# Patient Record
Sex: Male | Born: 1940 | Race: White | Hispanic: No | Marital: Married | Smoking: Current every day smoker
Health system: Southern US, Community
[De-identification: ages and names within clinical notes are randomized; demographics above are authoritative.]

## PROBLEM LIST (undated history)

## (undated) DIAGNOSIS — E785 Hyperlipidemia, unspecified: Secondary | ICD-10-CM

## (undated) DIAGNOSIS — I2510 Atherosclerotic heart disease of native coronary artery without angina pectoris: Secondary | ICD-10-CM

## (undated) DIAGNOSIS — J449 Chronic obstructive pulmonary disease, unspecified: Secondary | ICD-10-CM

## (undated) DIAGNOSIS — I739 Peripheral vascular disease, unspecified: Secondary | ICD-10-CM

## (undated) HISTORY — PX: OTHER SURGICAL HISTORY: SHX169

## (undated) SURGERY — INSERTION, CARDIAC PACEMAKER
Anesthesia: Moderate Sedation | Laterality: Left

---

## 2001-03-20 ENCOUNTER — Encounter: Payer: Self-pay | Admitting: Neurosurgery

## 2001-03-20 ENCOUNTER — Ambulatory Visit (HOSPITAL_COMMUNITY): Admission: RE | Admit: 2001-03-20 | Discharge: 2001-03-20 | Payer: Self-pay | Admitting: Neurosurgery

## 2009-05-09 ENCOUNTER — Ambulatory Visit: Payer: Self-pay | Admitting: Ophthalmology

## 2009-06-13 ENCOUNTER — Ambulatory Visit: Payer: Self-pay | Admitting: Ophthalmology

## 2009-07-25 ENCOUNTER — Ambulatory Visit: Payer: Self-pay | Admitting: Ophthalmology

## 2011-11-20 ENCOUNTER — Ambulatory Visit: Payer: Self-pay | Admitting: Vascular Surgery

## 2011-11-20 LAB — BASIC METABOLIC PANEL
Anion Gap: 9 (ref 7–16)
BUN: 15 mg/dL (ref 7–18)
Calcium, Total: 8.9 mg/dL (ref 8.5–10.1)
Chloride: 109 mmol/L — ABNORMAL HIGH (ref 98–107)
Co2: 23 mmol/L (ref 21–32)
Creatinine: 0.86 mg/dL (ref 0.60–1.30)
EGFR (African American): >60
EGFR (Non-African Amer.): >60
Glucose: 92 mg/dL (ref 65–99)
Osmolality: 282 (ref 275–301)
Potassium: 4.1 mmol/L (ref 3.5–5.1)
Sodium: 141 mmol/L (ref 136–145)

## 2012-02-03 ENCOUNTER — Ambulatory Visit: Payer: Self-pay | Admitting: Vascular Surgery

## 2012-02-03 LAB — CREATININE, SERUM
Creatinine: 0.89 mg/dL (ref 0.60–1.30)
EGFR (African American): >60
EGFR (Non-African Amer.): >60

## 2012-02-03 LAB — BUN: BUN: 17 mg/dL (ref 7–18)

## 2013-03-25 ENCOUNTER — Ambulatory Visit: Payer: Self-pay | Admitting: Internal Medicine

## 2013-04-08 ENCOUNTER — Ambulatory Visit: Payer: Self-pay | Admitting: Internal Medicine

## 2014-05-10 NOTE — Op Note (Signed)
PATIENT NAME:  Timothy Mcmahon, Timothy Mcmahon MR#:  [id number] DATE OF BIRTH:  05/19/1940  DATE OF PROCEDURE:  11/20/2011  PREOPERATIVE DIAGNOSES:  1. Peripheral artery disease with claudication of bilateral lower extremities.  2. Tobacco dependence.  3. Chronic obstructive pulmonary disease.  POSTOPERATIVE DIAGNOSES:  1. Peripheral artery disease with claudication of bilateral lower extremities.  2. Tobacco dependence.  3. Chronic obstructive pulmonary disease.  PROCEDURES PERFORMED: 1. Catheter placement into left popliteal artery from right femoral approach.  2. Aortogram and selective left lower extremity angiogram.  3. Percutaneous transluminal angioplasty of left SFA with 6 mm diameter angioplasty balloon.  4. Self-expanding stent placement to left SFA for greater than 30% residual stenosis and dissection after angioplasty in the left superficial femoral artery.   SURGEON: Algernon Huxley, M.D.   ANESTHESIA: Local with moderate conscious sedation.   ESTIMATED BLOOD LOSS: 25 mL.   INDICATION FOR PROCEDURE: This is a 74 year old white male with bilateral lower extremity claudication, left worse than right. This is severe and lifestyle limiting. He desires intervention. Risks and benefits were discussed and informed consent was obtained.   DESCRIPTION OF PROCEDURE: The patient was brought to the vascular interventional radiology suite, groins were shaved and prepped and a sterile surgical field was created. The right femoral head was localized with fluoroscopy and the right femoral artery was accessed without difficulty with a Seldinger needle. A J-wire and 5 French sheath were placed. Pigtail catheter was placed in the aorta at the L1 level and AP aortogram was performed. This showed no flow-limiting stenosis in the aortoiliac segments with two renal arteries on the left and one on the right which were patent. I then hooked the aortic bifurcation and advanced to the left femoral head and selective left  lower extremity angiogram was then performed. This demonstrated an occlusion of the SFA of approximately 8 to 10 cm beyond its origin. This reconstituted in the distal superficial femoral artery and he had two-vessel runoff distally. The patient was systemically heparinized. A 6 French Ansel sheath was placed over a Terumo Advantage wire. The Terumo Advantage wire and Kumpe catheter were used to cross the occlusion without difficulty and intraluminal flow was confirmed in the popliteal artery. I then removed the diagnostic catheter after replacing the wire and performed percutaneous transluminal angioplasty of this area with a 6 mm diameter angioplasty balloon. Following angioplasty there was a long spiral dissection with areas of high-grade stenosis, residual, both to the proximal and distal endpoint, and a 7 mm diameter x 27 cm in length was used to encompass this residual lesion and this was deployed and ironed out with a 6 mm balloon with excellent angiographic completion result. The sheath was then pulled back to the ipsilateral external iliac artery and oblique arteriogram was performed and StarClose closure device was deployed in the usual fashion with excellent hemostatic result. The patient tolerated the procedure well and was taken to the recovery room in stable condition.  ____________________________ Algernon Huxley, MD jsd:slb D: 11/20/2011 14:39:44 ET T: 11/20/2011 15:12:26 ET JOB#: [id number]  cc: Algernon Huxley, MD, <Dictator> Algernon Huxley MD ELECTRONICALLY SIGNED 11/25/2011 13:13

## 2014-05-13 NOTE — Op Note (Signed)
PATIENT NAME:  Timothy Mcmahon, Timothy Mcmahon MR#:  [id number] DATE OF BIRTH:  11-12-1940  DATE OF PROCEDURE:  02/03/2012  PREOPERATIVE DIAGNOSES:  1.  Peripheral arterial disease with short distant claudication, left lower extremity, recurrent.  2.  Tobacco dependence.  3.  Chronic obstructive pulmonary disease.   POSTOPERATIVE DIAGNOSES:  1.  Peripheral arterial disease with short distant claudication, left lower extremity, recurrent.  2.  Tobacco dependence.  3.  Chronic obstructive pulmonary disease.   PROCEDURES:  1.  Catheter placement into left popliteal artery from right femoral approach.  2.  Aortogram, selective left lower extremity angiogram.  3.  Percutaneous transluminal angioplasty of long superficial femoral artery occlusion was 6 mm diameter angioplasty balloon.  4.  Viabahn stent placement x3 to encompass the residual stenosis after angioplasty of the left superficial femoral artery.  5.  StarClose closure right femoral artery.   SURGEON: Algernon Huxley, M.D.   ANESTHESIA: Local with moderate conscious sedation.   ESTIMATED BLOOD LOSS: Approximately 25 mL.   fluoroscopy time: Approximately 14 minutes.   INDICATION FOR PROCEDURE: This is a 74 year old individual who has recurrent short distance claudication, left lower extremity. He is status post percutaneous intervention last year.  He is brought back for a repeat evaluation and possible intervention given the drop in his ABI from 1.2 to 0.5 and his short distance symptoms. Risks and benefits were discussed. Informed consent was obtained.   DESCRIPTION OF PROCEDURE: The patient was brought to the vascular interventional radiology suite. Groins were shaved and prepped and a sterile surgical field was created. The right femoral head was localized with fluoroscopy and the right femoral head was accessed without difficulty with a Seldinger needle. J-wire was placed. A 5 French sheath was then placed over the wire.  A pigtail catheter was  placed in the aorta at the L1 level.  Arteriogram was  performed. This showed two left renal arteries, one right renal artery, good renal perfusion. The aorta and iliac segments appeared patent, although there was calcific disease present. I then hooked the area of bifurcation and advanced to left femoral head and selective left lower extremity angiogram was then performed. This showed occlusion of the left superficial femoral artery about a centimeter or two beyond its origin.  The previously placed stent was occluded and had multiple areas of stent fracture within that stent. His above-knee popliteal artery then reconstituted and he had two-vessel runoff distally. The patient was systemically heparinized.  A 6 and then later a 7 [place] Ansel sheath was placed over a Terumo Advantage wire. I was able to cross the occluded stents without significant difficulty with the Terumo Advantage wire but could not get a Kumpe catheter to pass through the occluded struts.  A CXI catheter was able to traverse through it and we confirmed intraluminal flow in the popliteal artery. I then replaced the wire and ballooned this area with a 4 mm diameter angioplasty balloon and eventually the entire previously occluded stent, the SFA above and just below the stent with a 6 mm diameter angioplasty balloon.  There was still some significant residual disease; and with the stent fractures, I elected to line this with Viabahn stents.  I exchanged for an 0.018 wire but  still had to upsize to a 7 [place] sheath as we did not have enough 6 mm diameter Viabahn on the shelf, and we needed to place a 7 mm diameter Viabahn stent proximally. A 6 mm stent was placed in the mid section  to the most distal fracture within the stent.  Could not be crossed with a 6 mm Viabahn even after predilatation with a 7 mm balloon, and so I exchanged for a 5 mm Viabahn, which did cross the lesion. This was taken distally down to Hunter's canal and proximally  within the old stent. There was a 6 mm stent placed in the mid section and then a 7 mm stent proximally. These were all ironed out with a 6 mm high-pressure balloon, and completion angiogram now showed these areas to be widely patent with good preserve two-vessel runoff distally. At this point, I elected to terminate the procedure. The sheath was pulled back to the ipsilateral external artery and oblique arteriogram was performed. StarClose closure device was deployed in the usual fashion with excellent hemostatic result. The patient tolerated the procedure well and was taken to the recovery room in stable condition.     ____________________________ Algernon Huxley, MD jsd:th D: 02/03/2012 15:18:34 ET T: 02/03/2012 22:21:58 ET JOB#: [id number]  cc: Algernon Huxley, MD, <Dictator> DR. Alean Rinne Algernon Huxley MD ELECTRONICALLY SIGNED 02/07/2012 10:30

## 2015-01-22 HISTORY — PX: CARDIAC CATHETERIZATION: SHX172

## 2018-04-13 ENCOUNTER — Emergency Department: Payer: Medicare Other

## 2018-04-13 ENCOUNTER — Other Ambulatory Visit: Payer: Self-pay

## 2018-04-13 ENCOUNTER — Inpatient Hospital Stay
Admission: EM | Admit: 2018-04-13 | Discharge: 2018-04-16 | DRG: 308 | Disposition: A | Discharge status: Home / Self Care | Payer: Medicare Other | Attending: Internal Medicine | Admitting: Internal Medicine

## 2018-04-13 ENCOUNTER — Encounter: Payer: Self-pay | Admitting: Specialist

## 2018-04-13 DIAGNOSIS — J440 Chronic obstructive pulmonary disease with acute lower respiratory infection: Secondary | ICD-10-CM | POA: Diagnosis present

## 2018-04-13 DIAGNOSIS — K219 Gastro-esophageal reflux disease without esophagitis: Secondary | ICD-10-CM | POA: Diagnosis present

## 2018-04-13 DIAGNOSIS — I440 Atrioventricular block, first degree: Secondary | ICD-10-CM | POA: Diagnosis present

## 2018-04-13 DIAGNOSIS — Z95818 Presence of other cardiac implants and grafts: Secondary | ICD-10-CM

## 2018-04-13 DIAGNOSIS — J189 Pneumonia, unspecified organism: Secondary | ICD-10-CM | POA: Diagnosis present

## 2018-04-13 DIAGNOSIS — N400 Enlarged prostate without lower urinary tract symptoms: Secondary | ICD-10-CM | POA: Diagnosis present

## 2018-04-13 DIAGNOSIS — Z7902 Long term (current) use of antithrombotics/antiplatelets: Secondary | ICD-10-CM

## 2018-04-13 DIAGNOSIS — E876 Hypokalemia: Secondary | ICD-10-CM | POA: Diagnosis present

## 2018-04-13 DIAGNOSIS — Z886 Allergy status to analgesic agent status: Secondary | ICD-10-CM

## 2018-04-13 DIAGNOSIS — R911 Solitary pulmonary nodule: Secondary | ICD-10-CM | POA: Diagnosis present

## 2018-04-13 DIAGNOSIS — Z23 Encounter for immunization: Secondary | ICD-10-CM

## 2018-04-13 DIAGNOSIS — Z95820 Peripheral vascular angioplasty status with implants and grafts: Secondary | ICD-10-CM

## 2018-04-13 DIAGNOSIS — R55 Syncope and collapse: Secondary | ICD-10-CM | POA: Diagnosis present

## 2018-04-13 DIAGNOSIS — I10 Essential (primary) hypertension: Secondary | ICD-10-CM | POA: Diagnosis present

## 2018-04-13 DIAGNOSIS — D649 Anemia, unspecified: Secondary | ICD-10-CM | POA: Diagnosis present

## 2018-04-13 DIAGNOSIS — E785 Hyperlipidemia, unspecified: Secondary | ICD-10-CM | POA: Diagnosis present

## 2018-04-13 DIAGNOSIS — S51012A Laceration without foreign body of left elbow, initial encounter: Secondary | ICD-10-CM | POA: Diagnosis present

## 2018-04-13 DIAGNOSIS — I495 Sick sinus syndrome: Principal | ICD-10-CM | POA: Diagnosis present

## 2018-04-13 DIAGNOSIS — T82856A Stenosis of peripheral vascular stent, initial encounter: Secondary | ICD-10-CM | POA: Diagnosis present

## 2018-04-13 DIAGNOSIS — Z20828 Contact with and (suspected) exposure to other viral communicable diseases: Secondary | ICD-10-CM | POA: Diagnosis present

## 2018-04-13 DIAGNOSIS — I739 Peripheral vascular disease, unspecified: Secondary | ICD-10-CM | POA: Diagnosis present

## 2018-04-13 DIAGNOSIS — W1839XA Other fall on same level, initial encounter: Secondary | ICD-10-CM | POA: Diagnosis present

## 2018-04-13 DIAGNOSIS — I951 Orthostatic hypotension: Secondary | ICD-10-CM | POA: Diagnosis present

## 2018-04-13 DIAGNOSIS — Y939 Activity, unspecified: Secondary | ICD-10-CM

## 2018-04-13 DIAGNOSIS — Z8249 Family history of ischemic heart disease and other diseases of the circulatory system: Secondary | ICD-10-CM

## 2018-04-13 DIAGNOSIS — R42 Dizziness and giddiness: Secondary | ICD-10-CM

## 2018-04-13 DIAGNOSIS — Y929 Unspecified place or not applicable: Secondary | ICD-10-CM

## 2018-04-13 DIAGNOSIS — F17210 Nicotine dependence, cigarettes, uncomplicated: Secondary | ICD-10-CM | POA: Diagnosis present

## 2018-04-13 DIAGNOSIS — Z955 Presence of coronary angioplasty implant and graft: Secondary | ICD-10-CM

## 2018-04-13 DIAGNOSIS — N179 Acute kidney failure, unspecified: Secondary | ICD-10-CM | POA: Diagnosis present

## 2018-04-13 DIAGNOSIS — Y990 Civilian activity done for income or pay: Secondary | ICD-10-CM

## 2018-04-13 DIAGNOSIS — I2510 Atherosclerotic heart disease of native coronary artery without angina pectoris: Secondary | ICD-10-CM | POA: Diagnosis present

## 2018-04-13 HISTORY — DX: Chronic obstructive pulmonary disease, unspecified: J44.9

## 2018-04-13 HISTORY — DX: Peripheral vascular disease, unspecified: I73.9

## 2018-04-13 HISTORY — DX: Atherosclerotic heart disease of native coronary artery without angina pectoris: I25.10

## 2018-04-13 HISTORY — DX: Hyperlipidemia, unspecified: E78.5

## 2018-04-13 LAB — BASIC METABOLIC PANEL
ANION GAP: 10 (ref 5–15)
BUN: 23 mg/dL (ref 8–23)
CO2: 21 mmol/L — ABNORMAL LOW (ref 22–32)
Calcium: 8.5 mg/dL — ABNORMAL LOW (ref 8.9–10.3)
Chloride: 107 mmol/L (ref 98–111)
Creatinine, Ser: 1.4 mg/dL — ABNORMAL HIGH (ref 0.61–1.24)
GFR calc Af Amer: 56 mL/min — ABNORMAL LOW (ref >60)
GFR calc non Af Amer: 48 mL/min — ABNORMAL LOW (ref >60)
Glucose, Bld: 105 mg/dL — ABNORMAL HIGH (ref 70–99)
Potassium: 3 mmol/L — ABNORMAL LOW (ref 3.5–5.1)
Sodium: 138 mmol/L (ref 135–145)

## 2018-04-13 LAB — CBC
HEMATOCRIT: 38.6 % — AB (ref 39.0–52.0)
Hemoglobin: 12.7 g/dL — ABNORMAL LOW (ref 13.0–17.0)
MCH: 31.8 pg (ref 26.0–34.0)
MCHC: 32.9 g/dL (ref 30.0–36.0)
MCV: 96.5 fL (ref 80.0–100.0)
Platelets: 142 10*3/uL — ABNORMAL LOW (ref 150–400)
RBC: 4 MIL/uL — ABNORMAL LOW (ref 4.22–5.81)
RDW: 13.8 % (ref 11.5–15.5)
WBC: 9.4 10*3/uL (ref 4.0–10.5)
nRBC: 0 % (ref 0.0–0.2)

## 2018-04-13 LAB — BRAIN NATRIURETIC PEPTIDE: B Natriuretic Peptide: 159 pg/mL — ABNORMAL HIGH (ref 0.0–100.0)

## 2018-04-13 LAB — HEPATIC FUNCTION PANEL
ALT: 16 U/L (ref 0–44)
AST: 18 U/L (ref 15–41)
Albumin: 3.3 g/dL — ABNORMAL LOW (ref 3.5–5.0)
Alkaline Phosphatase: 92 U/L (ref 38–126)
Bilirubin, Direct: 0.1 mg/dL (ref 0.0–0.2)
Indirect Bilirubin: 0.5 mg/dL (ref 0.3–0.9)
Total Bilirubin: 0.6 mg/dL (ref 0.3–1.2)
Total Protein: 6.6 g/dL (ref 6.5–8.1)

## 2018-04-13 LAB — TROPONIN I
Troponin I: <0.03 ng/mL (ref <0.03)
Troponin I: <0.03 ng/mL (ref <0.03)

## 2018-04-13 MED ORDER — ALBUTEROL SULFATE (2.5 MG/3ML) 0.083% IN NEBU: 2.5000 mg | INHALATION_SOLUTION | Freq: Four times a day (QID) | RESPIRATORY_TRACT | Status: DC | PRN

## 2018-04-13 MED ORDER — PANTOPRAZOLE SODIUM 40 MG PO TBEC
40.0000 mg | DELAYED_RELEASE_TABLET | Freq: Every day | ORAL | Status: DC
  Administered 2018-04-14 – 2018-04-16 (×3): 40 mg via ORAL
  Filled 2018-04-13 (×3): qty 1

## 2018-04-13 MED ORDER — HEPARIN SODIUM (PORCINE) 5000 UNIT/ML IJ SOLN
5000.0000 [IU] | Freq: Three times a day (TID) | INTRAMUSCULAR | Status: DC
  Administered 2018-04-13 – 2018-04-15 (×5): 5000 [IU] via SUBCUTANEOUS
  Filled 2018-04-13 (×5): qty 1

## 2018-04-13 MED ORDER — ONDANSETRON HCL 4 MG PO TABS: 4.0000 mg | ORAL_TABLET | Freq: Four times a day (QID) | ORAL | Status: DC | PRN

## 2018-04-13 MED ORDER — TRAZODONE HCL 100 MG PO TABS
100.0000 mg | ORAL_TABLET | Freq: Every day | ORAL | Status: DC
  Administered 2018-04-13 – 2018-04-15 (×3): 100 mg via ORAL
  Filled 2018-04-13 (×3): qty 1

## 2018-04-13 MED ORDER — MOMETASONE FURO-FORMOTEROL FUM 200-5 MCG/ACT IN AERO
2.0000 | INHALATION_SPRAY | Freq: Two times a day (BID) | RESPIRATORY_TRACT | Status: DC
  Administered 2018-04-14 – 2018-04-16 (×5): 2 via RESPIRATORY_TRACT
  Filled 2018-04-13: qty 8.8

## 2018-04-13 MED ORDER — SODIUM CHLORIDE 0.9 % IV SOLN
INTRAVENOUS | Status: AC
  Administered 2018-04-13 – 2018-04-14 (×2): via INTRAVENOUS

## 2018-04-13 MED ORDER — METOPROLOL TARTRATE 25 MG PO TABS
25.0000 mg | ORAL_TABLET | Freq: Two times a day (BID) | ORAL | Status: DC
  Administered 2018-04-13 – 2018-04-16 (×6): 25 mg via ORAL
  Filled 2018-04-13 (×6): qty 1

## 2018-04-13 MED ORDER — ACETAMINOPHEN 325 MG PO TABS
650.0000 mg | ORAL_TABLET | Freq: Four times a day (QID) | ORAL | Status: DC | PRN
  Administered 2018-04-14 – 2018-04-15 (×2): 650 mg via ORAL
  Filled 2018-04-13 (×2): qty 2

## 2018-04-13 MED ORDER — ACETAMINOPHEN 650 MG RE SUPP: 650.0000 mg | Freq: Four times a day (QID) | RECTAL | Status: DC | PRN

## 2018-04-13 MED ORDER — TIOTROPIUM BROMIDE MONOHYDRATE 18 MCG IN CAPS
1.0000 | ORAL_CAPSULE | Freq: Every day | RESPIRATORY_TRACT | Status: DC
  Administered 2018-04-14 – 2018-04-16 (×3): 18 ug via RESPIRATORY_TRACT
  Filled 2018-04-13: qty 5

## 2018-04-13 MED ORDER — TAMSULOSIN HCL 0.4 MG PO CAPS
0.4000 mg | ORAL_CAPSULE | Freq: Every day | ORAL | Status: DC
  Administered 2018-04-14 – 2018-04-16 (×3): 0.4 mg via ORAL
  Filled 2018-04-13 (×3): qty 1

## 2018-04-13 MED ORDER — SODIUM CHLORIDE 0.9% FLUSH: 3.0000 mL | Freq: Once | INTRAVENOUS | Status: DC

## 2018-04-13 MED ORDER — CLOPIDOGREL BISULFATE 75 MG PO TABS
75.0000 mg | ORAL_TABLET | Freq: Every day | ORAL | Status: DC
  Administered 2018-04-14: 75 mg via ORAL
  Filled 2018-04-13: qty 1

## 2018-04-13 MED ORDER — ATORVASTATIN CALCIUM 20 MG PO TABS
80.0000 mg | ORAL_TABLET | Freq: Every day | ORAL | Status: DC
  Administered 2018-04-14 – 2018-04-16 (×3): 80 mg via ORAL
  Filled 2018-04-13 (×3): qty 4

## 2018-04-13 MED ORDER — ONDANSETRON HCL 4 MG/2ML IJ SOLN: 4.0000 mg | Freq: Four times a day (QID) | INTRAMUSCULAR | Status: DC | PRN

## 2018-04-13 MED ORDER — POTASSIUM CHLORIDE CRYS ER 20 MEQ PO TBCR
40.0000 meq | EXTENDED_RELEASE_TABLET | Freq: Once | ORAL | Status: AC
  Administered 2018-04-13: 40 meq via ORAL
  Filled 2018-04-13: qty 2

## 2018-04-13 MED ORDER — IOHEXOL 350 MG/ML SOLN
75.0000 mL | Freq: Once | INTRAVENOUS | Status: AC | PRN
  Administered 2018-04-13: 75 mL via INTRAVENOUS

## 2018-04-13 MED ORDER — INFLUENZA VAC SPLIT HIGH-DOSE 0.5 ML IM SUSY
0.5000 mL | PREFILLED_SYRINGE | INTRAMUSCULAR | Status: DC
  Filled 2018-04-13: qty 0.5

## 2018-04-13 MED ORDER — PNEUMOCOCCAL VAC POLYVALENT 25 MCG/0.5ML IJ INJ: 0.5000 mL | INJECTION | INTRAMUSCULAR | Status: DC

## 2018-04-13 MED ORDER — SODIUM CHLORIDE 0.9 % IV BOLUS
1000.0000 mL | Freq: Once | INTRAVENOUS | Status: AC
  Administered 2018-04-13: 1000 mL via INTRAVENOUS

## 2018-04-13 NOTE — ED Notes (Signed)
ED TO INPATIENT HANDOFF REPORT  ED Nurse Name and Phone #: Anderson Malta [phone]  S Name/Age/Gender Timothy Mcmahon 78 y.o. male Room/Bed: ED04A/ED04A  Code Status   Code Status: Not on file  Home/SNF/Other Home Patient oriented to: self, place, time and situation Is this baseline? Yes   Triage Complete: Triage complete  Chief Complaint syncopal ems  Triage Note Pt arrived via [facility] with near sycope from work. Pt got dizzy and fell, skin tear to left elbow. Pt did not eat today, Last supper was 1800 yesterday evening. Pt is orthostatic positive. Systolic 99 to 79. EMS gave fluids, BP resonded but dropped when fluids decreased. cbg-110.     Allergies Allergies  Allergen Reactions  . Aspirin Nausea And Vomiting    With high dose ASA only    Level of Care/Admitting Diagnosis ED Disposition    ED Disposition Condition [facility] Area: [place] [[id number]]  Level of Care: Telemetry [5]  Diagnosis: Pre-syncope [867544]  Admitting Physician: Henreitta Leber [[id number]]  Attending Physician: Henreitta Leber [[id number]]  PT Class (Do Not Modify): Observation [104]  PT Acc Code (Do Not Modify): Observation [10022]       B Medical/Surgery History No past medical history on file.    A IV Location/Drains/Wounds Patient Lines/Drains/Airways Status   Active Line/Drains/Airways    Name:   Placement date:   Placement time:   Site:   Days:   Peripheral IV 04/13/18 Left Forearm   04/13/18    1106    Forearm   less than 1          Intake/Output Last 24 hours No intake or output data in the 24 hours ending 04/13/18 1515  Labs/Imaging Results for orders placed or performed during the hospital encounter of 04/13/18 (from the past 48 hour(s))  Basic metabolic panel     Status: Abnormal   Collection Time: 04/13/18 11:05 AM  Result Value Ref Range   Sodium 138 135 - 145 mmol/L   Potassium 3.0 (L) 3.5 - 5.1 mmol/L   Chloride 107 98 - 111  mmol/L   CO2 21 (L) 22 - 32 mmol/L   Glucose, Bld 105 (H) 70 - 99 mg/dL   BUN 23 8 - 23 mg/dL   Creatinine, Ser 1.40 (H) 0.61 - 1.24 mg/dL   Calcium 8.5 (L) 8.9 - 10.3 mg/dL   GFR calc non Af Amer 48 (L) >60 mL/min   GFR calc Af Amer 56 (L) >60 mL/min   Anion gap 10 5 - 15    Comment: Performed at [facility], [address] [place], [place] [address]  CBC     Status: Abnormal   Collection Time: 04/13/18 11:05 AM  Result Value Ref Range   WBC 9.4 4.0 - 10.5 K/uL   RBC 4.00 (L) 4.22 - 5.81 MIL/uL   Hemoglobin 12.7 (L) 13.0 - 17.0 g/dL   HCT 38.6 (L) 39.0 - 52.0 %   MCV 96.5 80.0 - 100.0 fL   MCH 31.8 26.0 - 34.0 pg   MCHC 32.9 30.0 - 36.0 g/dL   RDW 13.8 11.5 - 15.5 %   Platelets 142 (L) 150 - 400 K/uL   nRBC 0.0 0.0 - 0.2 %    Comment: Performed at [facility], [address], [place], [place] [address]  Hepatic function panel     Status: Abnormal   Collection Time: 04/13/18 11:05 AM  Result Value Ref Range  Total Protein 6.6 6.5 - 8.1 g/dL   Albumin 3.3 (L) 3.5 - 5.0 g/dL   AST 18 15 - 41 U/L   ALT 16 0 - 44 U/L   Alkaline Phosphatase 92 38 - 126 U/L   Total Bilirubin 0.6 0.3 - 1.2 mg/dL   Bilirubin, Direct 0.1 0.0 - 0.2 mg/dL   Indirect Bilirubin 0.5 0.3 - 0.9 mg/dL    Comment: Performed at [facility], [address] [place], [place] [address]  Brain natriuretic peptide     Status: Abnormal   Collection Time: 04/13/18 11:05 AM  Result Value Ref Range   B Natriuretic Peptide 159.0 (H) 0.0 - 100.0 pg/mL    Comment: Performed at [facility], [address] [place], [place] [address]  Troponin I - Add-On to previous collection     Status: None   Collection Time: 04/13/18 11:05 AM  Result Value Ref Range   Troponin I <0.03 <0.03 ng/mL    Comment: Performed at [facility], [address], [place], [place] [address]   Ct Angio Head W Or Wo Contrast  Result Date: 04/13/2018 CLINICAL DATA:  Persistent central  vertigo. EXAM: CT ANGIOGRAPHY HEAD AND NECK TECHNIQUE: Multidetector CT imaging of the head and neck was performed using the standard protocol during bolus administration of intravenous contrast. Multiplanar CT image reconstructions and MIPs were obtained to evaluate the vascular anatomy. Carotid stenosis measurements (when applicable) are obtained utilizing NASCET criteria, using the distal internal carotid diameter as the denominator. CONTRAST:  28mL OMNIPAQUE IOHEXOL 350 MG/ML SOLN COMPARISON:  CTA head 05/01/2016. FINDINGS: CT HEAD FINDINGS Brain: No evidence of acute infarction, hemorrhage, hydrocephalus, extra-axial collection or mass lesion/mass effect. Vascular: Reported separately. Skull: Normal. Negative for fracture or focal lesion. Sinuses: Imaged portions are clear. Orbits: No acute finding. Review of the MIP images confirms the above findings CTA NECK FINDINGS Aortic arch: Variant branching, LEFT vertebral arises directly from arch imaged portion shows no evidence of aneurysm or dissection. No significant stenosis of the innominate, common carotid, or RIGHT subclavian origins. 75-90% stenosis proximal LEFT subclavian. Aortic atherosclerosis. Right carotid system: Minor atheromatous change RIGHT common carotid origin. RIGHT carotid stent extends from the common carotid artery across the bifurcation into the proximal ICA. IntraStent stenosis in the proximal RIGHT ICA, of 50% based on luminal diameter of 2.0/4.0 proximal/distal. See series 8, image 102. Left carotid system: No evidence of dissection, stenosis (50% or greater) or occlusion, status post LEFT carotid endarterectomy. Vertebral arteries: 50% ostial stenosis on the RIGHT. No ostial stenosis on the LEFT. Vertebrals are patent throughout the neck, with a RIGHT mildly dominant. Skeleton: No worrisome osseous lesion.  C6-C7 fusion. Other neck: No masses. Upper chest: Azygos lobe. Scarring and emphysema. Fluid and calcification in the azygos  fissure. Review of the MIP images confirms the above findings CTA HEAD FINDINGS Anterior circulation: Calcification of the cavernous internal carotid arteries consistent with cerebrovascular atherosclerotic disease. Estimated BILATERAL calcific siphon stenoses are 50-75%, stable from priors. No signs of intracranial large vessel occlusion. Posterior circulation: RIGHT vertebral dominant. LEFT vertebral supplies PICA, with rudimentary basilar connection. There is at estimated 50% stenosis of the proximal basilar. Distal basilar appears normal caliber. No cerebellar branch occlusion. Venous sinuses: As permitted by contrast timing, patent. Anatomic variants: None of significance. Delayed phase: No abnormal postcontrast enhancement. Review of the MIP images confirms the above findings IMPRESSION: Status post LEFT carotid endarterectomy, widely patent. 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 Non dominant LEFT vertebral, arising from the arch, widely patent. 50% stenosis  dominant RIGHT vertebral, stable from priors. 50% stenosis proximal basilar, stable from priors. 50-75% calcific stenosis both carotid siphons, stable from priors. Electronically Signed   By: Staci Righter M.D.   On: 04/13/2018 13:20   Dg Chest 2 View  Result Date: 04/13/2018 CLINICAL DATA:  Orthostatic hypotension. EXAM: CHEST - 2 VIEW COMPARISON:  CT chest dated July 04, 2017. Chest x-ray dated May 01, 2016. FINDINGS: New loop recorder. The heart size and mediastinal contours are within normal limits. Normal pulmonary vascularity. 8 mm nodular density in the left upper lobe appears slightly increased when compared to prior chest CT. Minimal left basilar atelectasis. No focal consolidation, pleural effusion, or pneumothorax. No acute osseous abnormality. IMPRESSION: 1. 8 mm nodular density in the left upper lobe appears slightly increased when compared  to prior chest CT. Further evaluation with non-emergent chest CT is recommended. 2.  No active cardiopulmonary disease. Electronically Signed   By: Titus Dubin M.D.   On: 04/13/2018 12:35   Ct Angio Neck W And/or Wo Contrast  Result Date: 04/13/2018 CLINICAL DATA:  Persistent central vertigo. EXAM: CT ANGIOGRAPHY HEAD AND NECK TECHNIQUE: Multidetector CT imaging of the head and neck was performed using the standard protocol during bolus administration of intravenous contrast. Multiplanar CT image reconstructions and MIPs were obtained to evaluate the vascular anatomy. Carotid stenosis measurements (when applicable) are obtained utilizing NASCET criteria, using the distal internal carotid diameter as the denominator. CONTRAST:  54mL OMNIPAQUE IOHEXOL 350 MG/ML SOLN COMPARISON:  CTA head 05/01/2016. FINDINGS: CT HEAD FINDINGS Brain: No evidence of acute infarction, hemorrhage, hydrocephalus, extra-axial collection or mass lesion/mass effect. Vascular: Reported separately. Skull: Normal. Negative for fracture or focal lesion. Sinuses: Imaged portions are clear. Orbits: No acute finding. Review of the MIP images confirms the above findings CTA NECK FINDINGS Aortic arch: Variant branching, LEFT vertebral arises directly from arch imaged portion shows no evidence of aneurysm or dissection. No significant stenosis of the innominate, common carotid, or RIGHT subclavian origins. 75-90% stenosis proximal LEFT subclavian. Aortic atherosclerosis. Right carotid system: Minor atheromatous change RIGHT common carotid origin. RIGHT carotid stent extends from the common carotid artery across the bifurcation into the proximal ICA. IntraStent stenosis in the proximal RIGHT ICA, of 50% based on luminal diameter of 2.0/4.0 proximal/distal. See series 8, image 102. Left carotid system: No evidence of dissection, stenosis (50% or greater) or occlusion, status post LEFT carotid endarterectomy. Vertebral arteries: 50% ostial  stenosis on the RIGHT. No ostial stenosis on the LEFT. Vertebrals are patent throughout the neck, with a RIGHT mildly dominant. Skeleton: No worrisome osseous lesion.  C6-C7 fusion. Other neck: No masses. Upper chest: Azygos lobe. Scarring and emphysema. Fluid and calcification in the azygos fissure. Review of the MIP images confirms the above findings CTA HEAD FINDINGS Anterior circulation: Calcification of the cavernous internal carotid arteries consistent with cerebrovascular atherosclerotic disease. Estimated BILATERAL calcific siphon stenoses are 50-75%, stable from priors. No signs of intracranial large vessel occlusion. Posterior circulation: RIGHT vertebral dominant. LEFT vertebral supplies PICA, with rudimentary basilar connection. There is at estimated 50% stenosis of the proximal basilar. Distal basilar appears normal caliber. No cerebellar branch occlusion. Venous sinuses: As permitted by contrast timing, patent. Anatomic variants: None of significance. Delayed phase: No abnormal postcontrast enhancement. Review of the MIP images confirms the above findings IMPRESSION: Status post LEFT carotid endarterectomy, widely patent. IntraStent stenosis, proximal  RIGHT ICA, 50%. 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 Non dominant LEFT vertebral, arising from the arch, widely patent. 50% stenosis  dominant RIGHT vertebral, stable from priors. 50% stenosis proximal basilar, stable from priors. 50-75% calcific stenosis both carotid siphons, stable from priors. Electronically Signed   By: Staci Righter M.D.   On: 04/13/2018 13:20    Pending Labs Unresulted Labs (From admission, onward)    Start     Ordered   04/13/18 1105  Urinalysis, Complete w Microscopic  ONCE - STAT,   STAT     04/13/18 1104   Signed and Held  CBC  (heparin)  Once,   R    Comments:  Baseline for heparin therapy IF NOT ALREADY DRAWN.  Notify MD if PLT < 100 K.    Signed and Held    Signed and Held  Creatinine, serum  (heparin)  Once,   R    Comments:  Baseline for heparin therapy IF NOT ALREADY DRAWN.    Signed and Held   Signed and Held  Troponin I - Now Then Q4H  Now then every 4 hours,   R     Signed and Held          Vitals/Pain Today's Vitals   04/13/18 1102 04/13/18 1103 04/13/18 1300  BP: 137/64  (!) 141/74  Pulse: 67  72  Resp: 16  18  Temp: 97.6 F (36.4 C)    TempSrc: Oral    SpO2: 99%  95%  Weight:  71.7 kg   Height:  5\' 11"  (1.803 m)   PainSc:  0-No pain     Isolation Precautions No active isolations  Medications Medications  sodium chloride flush (NS) 0.9 % injection 3 mL (3 mLs Intravenous Not Given 04/13/18 1116)  sodium chloride 0.9 % bolus 1,000 mL (0 mLs Intravenous Stopped 04/13/18 1302)  potassium chloride SA (K-DUR,KLOR-CON) CR tablet 40 mEq (40 mEq Oral Given 04/13/18 1301)  iohexol (OMNIPAQUE) 350 MG/ML injection 75 mL (75 mLs Intravenous Contrast Given 04/13/18 1246)    Mobility walks Low fall risk   Focused Assessments Cardiac Assessment Handoff:  Cardiac Rhythm: Normal sinus rhythm Lab Results  Component Value Date   TROPONINI <0.03 04/13/2018   No results found for: DDIMER Does the Patient currently have chest pain? No     R Recommendations: See Admitting Provider Note  Report given to:   Additional Notes:

## 2018-04-13 NOTE — ED Triage Notes (Signed)
Pt arrived via [facility] with near sycope from work. Pt got dizzy and fell, skin tear to left elbow. Pt did not eat today, Last supper was 1800 yesterday evening. Pt is orthostatic positive. Systolic 99 to 79. EMS gave fluids, BP resonded but dropped when fluids decreased. cbg-110.

## 2018-04-13 NOTE — H&P (Signed)
[facility] [place] NAME: Timothy Mcmahon    MR#:  [id number]  DATE OF BIRTH:  1940/05/27  DATE OF ADMISSION:  04/13/2018  PRIMARY CARE PHYSICIAN: Coy Saunas, MD   REQUESTING/REFERRING PHYSICIAN: Dr. Aundria Rud  CHIEF COMPLAINT:   Chief Complaint  Patient presents with   Near Syncope    HISTORY OF PRESENT ILLNESS:  Timothy Mcmahon  is a 78 y.o. male with a known history of coronary artery disease, COPD, hypertension, hyperlipidemia, peripheral vascular disease who presents to the hospital due to recurrent presyncopal episodes.  Patient says he has had these symptoms before and recent saw his cardiologist at [facility] and had a Linq device placed in his chest.  This was just done about a month or so ago, but today at work he had 2 episodes where he felt dizzy and had to sit down but never truly passed out.  He denies any nausea vomiting chest pains fever chills cough or any other associated symptoms presently.  He presented to the ER and was noted to be mildly hypokalemic with acute kidney injury and the hospitalist services were contacted for admission.  PAST MEDICAL HISTORY:   Past Medical History:  Diagnosis Date   CAD (coronary artery disease)    COPD (chronic obstructive pulmonary disease) ([place])    Hyperlipidemia    Peripheral vascular disease ([place])     PAST SURGICAL HISTORY:    SOCIAL HISTORY:   Social History   Tobacco Use   Smoking status: Current Every Day Smoker    Packs/day: 0.75    Years: 52.00    Pack years: 39.00    Types: Cigarettes  Substance Use Topics   Alcohol use: Not Currently    FAMILY HISTORY:   Family History  Problem Relation Age of Onset   Heart attack Mother    Cancer Brother     DRUG ALLERGIES:   Allergies  Allergen Reactions   Aspirin Nausea And Vomiting    With high dose ASA only    REVIEW OF SYSTEMS:   Review of Systems  Constitutional: Negative for fever and weight  loss.  HENT: Negative for congestion, nosebleeds and tinnitus.   Eyes: Negative for blurred vision, double vision and redness.  Respiratory: Negative for cough, hemoptysis and shortness of breath.   Cardiovascular: Negative for chest pain, orthopnea, leg swelling and PND.  Gastrointestinal: Negative for abdominal pain, diarrhea, melena, nausea and vomiting.  Genitourinary: Negative for dysuria, hematuria and urgency.  Musculoskeletal: Negative for falls and joint pain.  Neurological: Positive for dizziness. Negative for tingling, sensory change, focal weakness, seizures, weakness and headaches.  Endo/Heme/Allergies: Negative for polydipsia. Does not bruise/bleed easily.  Psychiatric/Behavioral: Negative for depression and memory loss. The patient is not nervous/anxious.     MEDICATIONS AT HOME:   Prior to Admission medications   Medication Sig Start Date End Date Taking? Authorizing Provider  albuterol (PROVENTIL HFA;VENTOLIN HFA) 108 (90 Base) MCG/ACT inhaler Inhale 1-2 puffs into the lungs every 6 (six) hours as needed for wheezing. 12/27/14  Yes [provider]  albuterol (PROVENTIL) (2.5 MG/3ML) 0.083% nebulizer solution Take 3 mLs by nebulization 2 (two) times daily. 04/24/17 04/24/18 Yes [provider]  atorvastatin (LIPITOR) 80 MG tablet Take 80 mg by mouth daily. 02/03/18  Yes [provider]  budesonide-formoterol (SYMBICORT) 160-4.5 MCG/ACT inhaler Inhale 2 puffs into the lungs 2 (two) times daily. 06/30/17  Yes [provider]  clopidogrel (PLAVIX) 75 MG tablet  Take 75 mg by mouth daily. 02/25/18  Yes [provider]  ibuprofen (ADVIL,MOTRIN) 200 MG tablet Take 600 mg by mouth 2 (two) times daily.   Yes [provider]  lisinopril (PRINIVIL,ZESTRIL) 5 MG tablet Take 5 mg by mouth daily. 04/09/18  Yes [provider]  metoprolol tartrate (LOPRESSOR) 25 MG tablet Take 25 mg by mouth 2 (two) times daily. 03/23/18  Yes [provider]  omeprazole (PRILOSEC) 20 MG capsule Take 20 mg by mouth daily. 02/15/18  Yes [provider]  Sodium Chloride, Inhalant, 7 % NEBU Take 4 mLs by nebulization 2 (two) times daily. 04/24/17 04/24/18 Yes [provider]  tamsulosin (FLOMAX) 0.4 MG CAPS capsule Take 0.4 mg by mouth daily. 02/03/18  Yes [provider]  Tiotropium Bromide Monohydrate (SPIRIVA RESPIMAT) 2.5 MCG/ACT AERS Inhale 2 puffs into the lungs daily. 10/07/17  Yes [provider]  traZODone (DESYREL) 100 MG tablet Take 100 mg by mouth at bedtime. 04/06/18  Yes [provider]      VITAL SIGNS:  Blood pressure (!) 141/74, pulse 72, temperature 97.6 F (36.4 C), temperature source Oral, resp. rate 18, height 5\' 11"  (1.803 m), weight 71.7 kg, SpO2 95 %.  PHYSICAL EXAMINATION:  Physical Exam  GENERAL:  78 y.o.-year-old patient lying in the bed with no acute distress.  EYES: Pupils equal, round, reactive to light and accommodation. No scleral icterus. Extraocular muscles intact.  HEENT: Head atraumatic, normocephalic. Oropharynx and nasopharynx clear. No oropharyngeal erythema, moist oral mucosa  NECK:  Supple, no jugular venous distention. No thyroid enlargement, no tenderness.  LUNGS: Normal breath sounds bilaterally, no wheezing, rales, rhonchi. No use of accessory muscles of respiration.  CARDIOVASCULAR: S1, S2 RRR. No murmurs, rubs, gallops, clicks.  ABDOMEN: Soft, nontender, nondistended. Bowel sounds present. No organomegaly or mass.  EXTREMITIES: No pedal edema, cyanosis, or clubbing. + 2 pedal & radial pulses b/l.   NEUROLOGIC: Cranial nerves II through XII are intact. No focal Motor or sensory deficits appreciated b/l PSYCHIATRIC: The patient is alert and oriented x 3. Good affect.  SKIN: No obvious rash, lesion, or ulcer.   LABORATORY PANEL:   CBC Recent Labs  Lab 04/13/18 1105  WBC 9.4  HGB 12.7*  HCT 38.6*  PLT 142*    ------------------------------------------------------------------------------------------------------------------  Chemistries  Recent Labs  Lab 04/13/18 1105  NA 138  K 3.0*  CL 107  CO2 21*  GLUCOSE 105*  BUN 23  CREATININE 1.40*  CALCIUM 8.5*  AST 18  ALT 16  ALKPHOS 92  BILITOT 0.6   ------------------------------------------------------------------------------------------------------------------  Cardiac Enzymes Recent Labs  Lab 04/13/18 1105  TROPONINI <0.03   ------------------------------------------------------------------------------------------------------------------  RADIOLOGY:  Ct Angio Head W Or Wo Contrast  Result Date: 04/13/2018 CLINICAL DATA:  Persistent central vertigo. EXAM: CT ANGIOGRAPHY HEAD AND NECK TECHNIQUE: Multidetector CT imaging of the head and neck was performed using the standard protocol during bolus administration of intravenous contrast. Multiplanar CT image reconstructions and MIPs were obtained to evaluate the vascular anatomy. Carotid stenosis measurements (when applicable) are obtained utilizing NASCET criteria, using the distal internal carotid diameter as the denominator. CONTRAST:  76mL OMNIPAQUE IOHEXOL 350 MG/ML SOLN COMPARISON:  CTA head 05/01/2016. FINDINGS: CT HEAD FINDINGS Brain: No evidence of acute infarction, hemorrhage, hydrocephalus, extra-axial collection or mass lesion/mass effect. Vascular: Reported separately. Skull: Normal. Negative for fracture or focal lesion. Sinuses: Imaged portions are clear. Orbits: No acute finding. Review of the MIP images confirms the above findings CTA NECK FINDINGS Aortic  arch: Variant branching, LEFT vertebral arises directly from arch imaged portion shows no evidence of aneurysm or dissection. No significant stenosis of the innominate, common carotid, or RIGHT subclavian origins. 75-90% stenosis proximal LEFT subclavian. Aortic atherosclerosis. Right carotid system: Minor atheromatous change  RIGHT common carotid origin. RIGHT carotid stent extends from the common carotid artery across the bifurcation into the proximal ICA. IntraStent stenosis in the proximal RIGHT ICA, of 50% based on luminal diameter of 2.0/4.0 proximal/distal. See series 8, image 102. Left carotid system: No evidence of dissection, stenosis (50% or greater) or occlusion, status post LEFT carotid endarterectomy. Vertebral arteries: 50% ostial stenosis on the RIGHT. No ostial stenosis on the LEFT. Vertebrals are patent throughout the neck, with a RIGHT mildly dominant. Skeleton: No worrisome osseous lesion.  C6-C7 fusion. Other neck: No masses. Upper chest: Azygos lobe. Scarring and emphysema. Fluid and calcification in the azygos fissure. Review of the MIP images confirms the above findings CTA HEAD FINDINGS Anterior circulation: Calcification of the cavernous internal carotid arteries consistent with cerebrovascular atherosclerotic disease. Estimated BILATERAL calcific siphon stenoses are 50-75%, stable from priors. No signs of intracranial large vessel occlusion. Posterior circulation: RIGHT vertebral dominant. LEFT vertebral supplies PICA, with rudimentary basilar connection. There is at estimated 50% stenosis of the proximal basilar. Distal basilar appears normal caliber. No cerebellar branch occlusion. Venous sinuses: As permitted by contrast timing, patent. Anatomic variants: None of significance. Delayed phase: No abnormal postcontrast enhancement. Review of the MIP images confirms the above findings IMPRESSION: Status post LEFT carotid endarterectomy, widely patent. IntraStent stenosis, proximal RIGHT ICA, 50%. 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 Non dominant LEFT vertebral, arising from the arch, widely patent. 50% stenosis  dominant RIGHT vertebral, stable from priors. 50% stenosis proximal basilar, stable from priors. 50-75% calcific stenosis both carotid siphons, stable  from priors. Electronically Signed   By: Staci Righter M.D.   On: 04/13/2018 13:20   Dg Chest 2 View  Result Date: 04/13/2018 CLINICAL DATA:  Orthostatic hypotension. EXAM: CHEST - 2 VIEW COMPARISON:  CT chest dated July 04, 2017. Chest x-ray dated May 01, 2016. FINDINGS: New loop recorder. The heart size and mediastinal contours are within normal limits. Normal pulmonary vascularity. 8 mm nodular density in the left upper lobe appears slightly increased when compared to prior chest CT. Minimal left basilar atelectasis. No focal consolidation, pleural effusion, or pneumothorax. No acute osseous abnormality. IMPRESSION: 1. 8 mm nodular density in the left upper lobe appears slightly increased when compared to prior chest CT. Further evaluation with non-emergent chest CT is recommended. 2.  No active cardiopulmonary disease. Electronically Signed   By: Titus Dubin M.D.   On: 04/13/2018 12:35   Ct Angio Neck W And/or Wo Contrast  Result Date: 04/13/2018 CLINICAL DATA:  Persistent central vertigo. EXAM: CT ANGIOGRAPHY HEAD AND NECK TECHNIQUE: Multidetector CT imaging of the head and neck was performed using the standard protocol during bolus administration of intravenous contrast. Multiplanar CT image reconstructions and MIPs were obtained to evaluate the vascular anatomy. Carotid stenosis measurements (when applicable) are obtained utilizing NASCET criteria, using the distal internal carotid diameter as the denominator. CONTRAST:  26mL OMNIPAQUE IOHEXOL 350 MG/ML SOLN COMPARISON:  CTA head 05/01/2016. FINDINGS: CT HEAD FINDINGS Brain: No evidence of acute infarction, hemorrhage, hydrocephalus, extra-axial collection or mass lesion/mass effect. Vascular: Reported separately. Skull: Normal. Negative for fracture or focal lesion. Sinuses: Imaged portions are clear. Orbits: No acute finding. Review of the MIP images confirms  the above findings CTA NECK FINDINGS Aortic arch: Variant branching, LEFT vertebral  arises directly from arch imaged portion shows no evidence of aneurysm or dissection. No significant stenosis of the innominate, common carotid, or RIGHT subclavian origins. 75-90% stenosis proximal LEFT subclavian. Aortic atherosclerosis. Right carotid system: Minor atheromatous change RIGHT common carotid origin. RIGHT carotid stent extends from the common carotid artery across the bifurcation into the proximal ICA. IntraStent stenosis in the proximal RIGHT ICA, of 50% based on luminal diameter of 2.0/4.0 proximal/distal. See series 8, image 102. Left carotid system: No evidence of dissection, stenosis (50% or greater) or occlusion, status post LEFT carotid endarterectomy. Vertebral arteries: 50% ostial stenosis on the RIGHT. No ostial stenosis on the LEFT. Vertebrals are patent throughout the neck, with a RIGHT mildly dominant. Skeleton: No worrisome osseous lesion.  C6-C7 fusion. Other neck: No masses. Upper chest: Azygos lobe. Scarring and emphysema. Fluid and calcification in the azygos fissure. Review of the MIP images confirms the above findings CTA HEAD FINDINGS Anterior circulation: Calcification of the cavernous internal carotid arteries consistent with cerebrovascular atherosclerotic disease. Estimated BILATERAL calcific siphon stenoses are 50-75%, stable from priors. No signs of intracranial large vessel occlusion. Posterior circulation: RIGHT vertebral dominant. LEFT vertebral supplies PICA, with rudimentary basilar connection. There is at estimated 50% stenosis of the proximal basilar. Distal basilar appears normal caliber. No cerebellar branch occlusion. Venous sinuses: As permitted by contrast timing, patent. Anatomic variants: None of significance. Delayed phase: No abnormal postcontrast enhancement. Review of the MIP images confirms the above findings IMPRESSION: Status post LEFT carotid endarterectomy, widely patent. 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 Non dominant LEFT vertebral, arising from the arch, widely patent. 50% stenosis  dominant RIGHT vertebral, stable from priors. 50% stenosis proximal basilar, stable from priors. 50-75% calcific stenosis both carotid siphons, stable from priors. Electronically Signed   By: Staci Righter M.D.   On: 04/13/2018 13:20     IMPRESSION AND PLAN:   78 year old male with past medical history of hypertension, peripheral vascular disease, COPD, hyperlipidemia who presented to the hospital due to recurrent presyncopal episodes.  1.  Recurrent presyncope- source unclear but suspected be cardiogenic in nature.  Patient's CT of the head neck shows no acute pathology.  Patient has a previous history of carotid artery stenosis with stable left-sided carotid endarterectomy which is widely patent. -Right ICA has 50% stenosis.  He has no neurologic symptoms. - Patient has a Linq/Loop monitor on his chest which was implanted about a month ago.  Will consult cardiology and have this interrogated. -Observe on telemetry, cycle cardiac markers.  Await further cardiology input.  Discussed the case with Dr. Nehemiah Massed. - check orthostatic vital signs.  2.  Hypokalemia-will replace potassium orally. -Repeat level in the morning, check magnesium level.  3.  Acute kidney injury-we will gently hydrate the patient with IV fluids. -Follow BUN/creatinine urine output.  Hold ACE inhibitor for now.  4.  Essential hypertension-continue metoprolol.  Hold lisinopril for now.  5.  BPH-continue Flomax.  No urine retention.  6.  GERD-continue Protonix.  7.  History of peripheral vascular disease-continue Plavix, atorvastatin.  8.  COPD-no acute exacerbation.  Continue Dulera, Spiriva and albuterol nebulizers as needed.    All the records are reviewed and case discussed with ED provider. Management plans discussed with the patient, family and they are in  agreement.  CODE STATUS: Full code  TOTAL TIME TAKING CARE OF  THIS PATIENT: 45 minutes.    Henreitta Leber M.D on 04/13/2018 at 3:21 PM  Between 7am to 6pm - Pager - [phone]  After 6pm go to [email or web address] - password [email or web address] [facility]  Office  [phone]  CC: Primary care physician; Coy Saunas, MD

## 2018-04-13 NOTE — ED Notes (Signed)
[facility]  TRANSFER  CALLED  PER  [facility]

## 2018-04-13 NOTE — ED Provider Notes (Addendum)
[facility] Emergency Department Provider Note  ____________________________________________  Time seen: Approximately 11:57 AM  I have reviewed the triage vital signs and the nursing notes.   HISTORY  Chief Complaint Near Syncope    HPI Timothy Mcmahon is a 78 y.o. male w/ a hx of CAD s/p PCI to mid LAD, SFA stenting, L carotid endarterectomy, TCAR R carotid, s/p AICD presenting w/ syncope and falls.  The patient reports that for the past several weeks, he has been feeling a lightheaded sensation, which is usually associated with bilateral lower extremity pain.  He has been meaning to see his vascular surgeon at [facility] for reevaluation of his peripheral vascular disease but has been unable to because of the coronavirus.  I have reviewed the patient's chart and he was seen by his cardiologist Dr. Dwyane Dee, on 2/28 at which time he was having presyncopal episodes as well.  He had a reassuring work-up and was advised to stay hydrated and continue his metoprolol.  The patient reports that he had to severe presyncopal episodes today.  He was at work and his first episode occurred in the bathroom which caused him to fall.  He did not lose consciousness or have associated chest pain or shortness of breath, but then he went back to his work area and had a second episode where he "would have fallen if my coworker had not caught me."  The patient was found to have a normal blood sugar of 110 by EMS but did have a systolic blood pressure which went from 99-79 with standing by EMS.  Patient denies any recent illness including nausea vomiting or diarrhea.   No past medical history on file.  There are no active problems to display for this patient.     Current Outpatient Rx  . Order #: [id number] Class: Historical Med  . Order #: [id number] Class: Historical Med  . Order #: [id number] Class: Historical Med  . Order #: [id number] Class: Historical Med  . Order #: [id number] Class: Historical  Med  . Order #: [id number] Class: Historical Med  . Order #: [id number] Class: Historical Med  . Order #: [id number] Class: Historical Med  . Order #: [id number] Class: Historical Med  . Order #: [id number] Class: Historical Med  . Order #: [id number] Class: Historical Med  . Order #: [id number] Class: Historical Med  . Order #: [id number] Class: Historical Med    Allergies Aspirin  No family history on file.  Social History Social History   Tobacco Use  . Smoking status: Not on file  Substance Use Topics  . Alcohol use: Not on file  . Drug use: Not on file    Review of Systems Constitutional: No fever/chills.  Positive lightheadedness with significant presyncope, recurrent and worsening. Eyes: No visual changes.  No blurred or double vision. ENT: No sore throat. No congestion or rhinorrhea. Cardiovascular: Denies chest pain. Denies palpitations. Respiratory: Denies shortness of breath.  Mild cough w/o congestion/rhinorrhea, sore throat, fever/chills. Gastrointestinal: No abdominal pain.  No nausea, no vomiting.  No diarrhea.  No constipation. Genitourinary: Negative for dysuria. Musculoskeletal: Negative for back pain.  Positive bilateral lower extremity pain. Skin: Negative for rash. Neurological: Negative for headaches. No focal numbness, tingling.  Lateral lower extremity weakness.  No changes in vision, speech or mental status.    ____________________________________________   PHYSICAL EXAM:  VITAL SIGNS: ED Triage Vitals  Enc Vitals Group     BP 04/13/18 1102 137/64     Pulse Rate 04/13/18 1102 67     Resp 04/13/18 1102 16  Temp 04/13/18 1102 97.6 F (36.4 C)     Temp Source 04/13/18 1102 Oral     SpO2 04/13/18 1102 99 %     Weight 04/13/18 1103 158 lb (71.7 kg)     Height 04/13/18 1103 5\' 11"  (1.803 m)     Head Circumference --      Peak Flow --      Pain Score 04/13/18 1103 0     Pain Loc --      Pain Edu? --      Excl. in [place]? --     Constitutional: Alert  and oriented.Answers questions appropriately.  Chronically ill-appearing Eyes: Conjunctivae are normal.  EOMI. No scleral icterus. Head: Atraumatic. Nose: No congestion/rhinnorhea. Mouth/Throat: Mucous membranes are moist.  Neck: No stridor.  Supple.  No JVD.  No meningismus..   Cardiovascular: Normal rate, regular rhythm. No murmurs, rubs or gallops.  Respiratory: Normal respiratory effort.  No accessory muscle use or retractions. Lungs CTAB.  No wheezes, rales or ronchi. Gastrointestinal: Soft, nontender and nondistended.  No guarding or rebound.  No peritoneal signs. Musculoskeletal: No LE edema. No ttp in the calves or palpable cords.  Negative Homan's sign.  The patient has palpable DP and PT pulses bilaterally with cap refill of 2 to 3 seconds.   Neurologic:  A&Ox3.  Speech is clear.  Face and smile are symmetric.  EOMI.  Moves all extremities well. Skin:  Skin is warm, dry and intact. No rash noted. Psychiatric: Mood and affect are normal. Speech and behavior are normal.  Normal judgement.  ____________________________________________   LABS (all labs ordered are listed, but only abnormal results are displayed)  Labs Reviewed  BASIC METABOLIC PANEL - Abnormal; Notable for the following components:      Result Value   Potassium 3.0 (*)    CO2 21 (*)    Glucose, Bld 105 (*)    Creatinine, Ser 1.40 (*)    Calcium 8.5 (*)    GFR calc non Af Amer 48 (*)    GFR calc Af Amer 56 (*)    All other components within normal limits  CBC - Abnormal; Notable for the following components:   RBC 4.00 (*)    Hemoglobin 12.7 (*)    HCT 38.6 (*)    Platelets 142 (*)    All other components within normal limits  HEPATIC FUNCTION PANEL - Abnormal; Notable for the following components:   Albumin 3.3 (*)    All other components within normal limits  BRAIN NATRIURETIC PEPTIDE - Abnormal; Notable for the following components:   B Natriuretic Peptide 159.0 (*)    All other components within  normal limits  TROPONIN I  URINALYSIS, COMPLETE (UACMP) WITH MICROSCOPIC  CBG MONITORING, ED   ____________________________________________  EKG  ED ECG REPORT I, Anne-Caroline Mariea Clonts, the attending physician, personally viewed and interpreted this ECG.   Date: 04/13/2018  EKG Time: 1109  Rate: 64  Rhythm: normal sinus rhythm  Axis: normal  Intervals:first-degree A-V block   ST&T Change: No STEMI  ____________________________________________  RADIOLOGY  Ct Angio Head W Or Wo Contrast  Result Date: 04/13/2018 CLINICAL DATA:  Persistent central vertigo. EXAM: CT ANGIOGRAPHY HEAD AND NECK TECHNIQUE: Multidetector CT imaging of the head and neck was performed using the standard protocol during bolus administration of intravenous contrast. Multiplanar CT image reconstructions and MIPs were obtained to evaluate the vascular anatomy. Carotid stenosis measurements (when applicable) are obtained utilizing NASCET criteria, using the distal internal carotid diameter as  the denominator. CONTRAST:  27mL OMNIPAQUE IOHEXOL 350 MG/ML SOLN COMPARISON:  CTA head 05/01/2016. FINDINGS: CT HEAD FINDINGS Brain: No evidence of acute infarction, hemorrhage, hydrocephalus, extra-axial collection or mass lesion/mass effect. Vascular: Reported separately. Skull: Normal. Negative for fracture or focal lesion. Sinuses: Imaged portions are clear. Orbits: No acute finding. Review of the MIP images confirms the above findings CTA NECK FINDINGS Aortic arch: Variant branching, LEFT vertebral arises directly from arch imaged portion shows no evidence of aneurysm or dissection. No significant stenosis of the innominate, common carotid, or RIGHT subclavian origins. 75-90% stenosis proximal LEFT subclavian. Aortic atherosclerosis. Right carotid system: Minor atheromatous change RIGHT common carotid origin. RIGHT carotid stent extends from the common carotid artery across the bifurcation into the proximal ICA. IntraStent  stenosis in the proximal RIGHT ICA, of 50% based on luminal diameter of 2.0/4.0 proximal/distal. See series 8, image 102. Left carotid system: No evidence of dissection, stenosis (50% or greater) or occlusion, status post LEFT carotid endarterectomy. Vertebral arteries: 50% ostial stenosis on the RIGHT. No ostial stenosis on the LEFT. Vertebrals are patent throughout the neck, with a RIGHT mildly dominant. Skeleton: No worrisome osseous lesion.  C6-C7 fusion. Other neck: No masses. Upper chest: Azygos lobe. Scarring and emphysema. Fluid and calcification in the azygos fissure. Review of the MIP images confirms the above findings CTA HEAD FINDINGS Anterior circulation: Calcification of the cavernous internal carotid arteries consistent with cerebrovascular atherosclerotic disease. Estimated BILATERAL calcific siphon stenoses are 50-75%, stable from priors. No signs of intracranial large vessel occlusion. Posterior circulation: RIGHT vertebral dominant. LEFT vertebral supplies PICA, with rudimentary basilar connection. There is at estimated 50% stenosis of the proximal basilar. Distal basilar appears normal caliber. No cerebellar branch occlusion. Venous sinuses: As permitted by contrast timing, patent. Anatomic variants: None of significance. Delayed phase: No abnormal postcontrast enhancement. Review of the MIP images confirms the above findings IMPRESSION: Status post LEFT carotid endarterectomy, widely patent. IntraStent stenosis, proximal RIGHT ICA, 50%. 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 Non dominant LEFT vertebral, arising from the arch, widely patent. 50% stenosis  dominant RIGHT vertebral, stable from priors. 50% stenosis proximal basilar, stable from priors. 50-75% calcific stenosis both carotid siphons, stable from priors. Electronically Signed   By: Staci Righter M.D.   On: 04/13/2018 13:20   Dg Chest 2 View  Result Date: 04/13/2018 CLINICAL DATA:   Orthostatic hypotension. EXAM: CHEST - 2 VIEW COMPARISON:  CT chest dated July 04, 2017. Chest x-ray dated May 01, 2016. FINDINGS: New loop recorder. The heart size and mediastinal contours are within normal limits. Normal pulmonary vascularity. 8 mm nodular density in the left upper lobe appears slightly increased when compared to prior chest CT. Minimal left basilar atelectasis. No focal consolidation, pleural effusion, or pneumothorax. No acute osseous abnormality. IMPRESSION: 1. 8 mm nodular density in the left upper lobe appears slightly increased when compared to prior chest CT. Further evaluation with non-emergent chest CT is recommended. 2.  No active cardiopulmonary disease. Electronically Signed   By: Titus Dubin M.D.   On: 04/13/2018 12:35   Ct Angio Neck W And/or Wo Contrast  Result Date: 04/13/2018 CLINICAL DATA:  Persistent central vertigo. EXAM: CT ANGIOGRAPHY HEAD AND NECK TECHNIQUE: Multidetector CT imaging of the head and neck was performed using the standard protocol during bolus administration of intravenous contrast. Multiplanar CT image reconstructions and MIPs were obtained to evaluate the vascular anatomy. Carotid stenosis measurements (when applicable) are obtained utilizing NASCET criteria,  using the distal internal carotid diameter as the denominator. CONTRAST:  28mL OMNIPAQUE IOHEXOL 350 MG/ML SOLN COMPARISON:  CTA head 05/01/2016. FINDINGS: CT HEAD FINDINGS Brain: No evidence of acute infarction, hemorrhage, hydrocephalus, extra-axial collection or mass lesion/mass effect. Vascular: Reported separately. Skull: Normal. Negative for fracture or focal lesion. Sinuses: Imaged portions are clear. Orbits: No acute finding. Review of the MIP images confirms the above findings CTA NECK FINDINGS Aortic arch: Variant branching, LEFT vertebral arises directly from arch imaged portion shows no evidence of aneurysm or dissection. No significant stenosis of the innominate, common carotid, or  RIGHT subclavian origins. 75-90% stenosis proximal LEFT subclavian. Aortic atherosclerosis. Right carotid system: Minor atheromatous change RIGHT common carotid origin. RIGHT carotid stent extends from the common carotid artery across the bifurcation into the proximal ICA. IntraStent stenosis in the proximal RIGHT ICA, of 50% based on luminal diameter of 2.0/4.0 proximal/distal. See series 8, image 102. Left carotid system: No evidence of dissection, stenosis (50% or greater) or occlusion, status post LEFT carotid endarterectomy. Vertebral arteries: 50% ostial stenosis on the RIGHT. No ostial stenosis on the LEFT. Vertebrals are patent throughout the neck, with a RIGHT mildly dominant. Skeleton: No worrisome osseous lesion.  C6-C7 fusion. Other neck: No masses. Upper chest: Azygos lobe. Scarring and emphysema. Fluid and calcification in the azygos fissure. Review of the MIP images confirms the above findings CTA HEAD FINDINGS Anterior circulation: Calcification of the cavernous internal carotid arteries consistent with cerebrovascular atherosclerotic disease. Estimated BILATERAL calcific siphon stenoses are 50-75%, stable from priors. No signs of intracranial large vessel occlusion. Posterior circulation: RIGHT vertebral dominant. LEFT vertebral supplies PICA, with rudimentary basilar connection. There is at estimated 50% stenosis of the proximal basilar. Distal basilar appears normal caliber. No cerebellar branch occlusion. Venous sinuses: As permitted by contrast timing, patent. Anatomic variants: None of significance. Delayed phase: No abnormal postcontrast enhancement. Review of the MIP images confirms the above findings IMPRESSION: Status post LEFT carotid endarterectomy, widely patent. IntraStent stenosis, proximal RIGHT ICA, 50%. 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 Non dominant LEFT vertebral, arising from the arch, widely patent. 50% stenosis  dominant  RIGHT vertebral, stable from priors. 50% stenosis proximal basilar, stable from priors. 50-75% calcific stenosis both carotid siphons, stable from priors. Electronically Signed   By: Staci Righter M.D.   On: 04/13/2018 13:20    ____________________________________________   PROCEDURES  Procedure(s) performed: None  Procedures  Critical Care performed: No ____________________________________________   INITIAL IMPRESSION / ASSESSMENT AND PLAN / ED COURSE  Pertinent labs & imaging results that were available during my care of the patient were reviewed by me and considered in my medical decision making (see chart for details).  78 y.o. male with a history of peripheral vascular disease as well as carotid disease presenting with recurrent and worsening episodes of lightheadedness with severe presyncope causing him to fall, associated with bilateral lower extremity pain.  Overall, the patient is hemodynamically stable here but will get orthostatics to see if hypovolemia may be contributing to his lightheadedness.  However, I am more concerned about the patient's carotids for his lightheaded feeling that I am about his lower extremities causing his presyncope.  CT of the head with CT angiogram of the head and neck have been ordered.  The patient's laboratory studies are also returning.  He is hypokalemic and has been supplemented.  His mild anemia is unchanged from prior.  I am awaiting the results of his troponin and BNP  but do not see ischemia or arrhythmia.  I have asked the nurses to interrogate his AICD.  The patient has requested to be transferred to [facility] where his primary physicians follow him.  ----------------------------------------- 12:24 PM on 04/13/2018 -----------------------------------------  I have spoken to [facility] who decline transfer at this time.  There are no findings which would necessitate care at [facility] that cannot be performed at [place] at this time.  I will call [facility] back if  anything comes up in the work-up that would be specific to [facility] for care.  I will let the patient know of my conversation with the hospitalist at [facility].  ----------------------------------------- 1:58 PM on 04/13/2018 ----------------------------------------- The patient CT angiogram shows patent left carotid endarterectomy site, some IntraStent stenosis in the right ICA at 50%, and multiple other stenotic areas that are stable.  The patient is not orthostatic.  At this time, I do not have a definitive diagnosis for why the patient is having not only recurrent but worsening presyncope which is resulting in him falling.  It is not safe to discharge him home until further evaluation and work-up has been completed.  He will be admitted to the hospitalist at this time.   ____________________________________________  FINAL CLINICAL IMPRESSION(S) / ED DIAGNOSES  Final diagnoses:  Postural dizziness with presyncope  Nodule of upper lobe of left lung         NEW MEDICATIONS STARTED DURING THIS VISIT:  New Prescriptions   No medications on file      Eula Listen, MD 04/13/18 1214    Eula Listen, MD 04/13/18 1224    Eula Listen, MD 04/13/18 1359

## 2018-04-13 NOTE — Progress Notes (Signed)
Patient admitted to 2A room 257 from ED via stretcher. Oriented to floor and surroundings.

## 2018-04-13 NOTE — ED Notes (Signed)
Attempted to call report.  Nurse is looking over information now and asked for return call in 23mins.

## 2018-04-14 LAB — TROPONIN I
Troponin I: <0.03 ng/mL (ref <0.03)
Troponin I: 0.03 ng/mL (ref <0.03)

## 2018-04-14 LAB — URINALYSIS, COMPLETE (UACMP) WITH MICROSCOPIC
Bacteria, UA: NONE SEEN
Bilirubin Urine: NEGATIVE
GLUCOSE, UA: NEGATIVE mg/dL
Ketones, ur: NEGATIVE mg/dL
Leukocytes,Ua: NEGATIVE
Nitrite: NEGATIVE
Protein, ur: NEGATIVE mg/dL
Specific Gravity, Urine: 1.015 (ref 1.005–1.030)
Squamous Epithelial / HPF: NONE SEEN (ref 0–5)
pH: 6 (ref 5.0–8.0)

## 2018-04-14 LAB — RESPIRATORY PANEL BY PCR
Adenovirus: NOT DETECTED
Bordetella pertussis: NOT DETECTED
Chlamydophila pneumoniae: NOT DETECTED
Coronavirus 229E: NOT DETECTED
Coronavirus HKU1: NOT DETECTED
Coronavirus NL63: NOT DETECTED
Coronavirus OC43: NOT DETECTED
Influenza A: NOT DETECTED
Influenza B: NOT DETECTED
Metapneumovirus: NOT DETECTED
Mycoplasma pneumoniae: NOT DETECTED
PARAINFLUENZA VIRUS 1-RVPPCR: NOT DETECTED
Parainfluenza Virus 2: NOT DETECTED
Parainfluenza Virus 3: NOT DETECTED
Parainfluenza Virus 4: NOT DETECTED
Respiratory Syncytial Virus: NOT DETECTED
Rhinovirus / Enterovirus: NOT DETECTED

## 2018-04-14 LAB — MRSA PCR SCREENING: MRSA BY PCR: NEGATIVE

## 2018-04-14 LAB — INFLUENZA PANEL BY PCR (TYPE A & B)
Influenza A By PCR: NEGATIVE
Influenza B By PCR: NEGATIVE

## 2018-04-14 MED ORDER — GUAIFENESIN 100 MG/5ML PO SOLN
5.0000 mL | Freq: Four times a day (QID) | ORAL | Status: DC | PRN
  Administered 2018-04-14 – 2018-04-16 (×4): 100 mg via ORAL
  Filled 2018-04-14 (×5): qty 5

## 2018-04-14 MED ORDER — PNEUMOCOCCAL VAC POLYVALENT 25 MCG/0.5ML IJ INJ
0.5000 mL | INJECTION | INTRAMUSCULAR | Status: AC
  Administered 2018-04-16: 0.5 mL via INTRAMUSCULAR
  Filled 2018-04-14: qty 0.5

## 2018-04-14 MED ORDER — ALBUTEROL SULFATE HFA 108 (90 BASE) MCG/ACT IN AERS
2.0000 | INHALATION_SPRAY | Freq: Four times a day (QID) | RESPIRATORY_TRACT | Status: DC | PRN
  Filled 2018-04-14: qty 6.7

## 2018-04-14 MED ORDER — SODIUM CHLORIDE 0.9 % IV SOLN
1.5000 g | Freq: Four times a day (QID) | INTRAVENOUS | Status: DC
  Administered 2018-04-14 – 2018-04-15 (×5): 1.5 g via INTRAVENOUS
  Filled 2018-04-14 (×7): qty 1.5

## 2018-04-14 MED ORDER — POTASSIUM CHLORIDE CRYS ER 20 MEQ PO TBCR
40.0000 meq | EXTENDED_RELEASE_TABLET | Freq: Once | ORAL | Status: AC
  Administered 2018-04-14: 40 meq via ORAL
  Filled 2018-04-14: qty 2

## 2018-04-14 MED ORDER — INFLUENZA VAC SPLIT HIGH-DOSE 0.5 ML IM SUSY
0.5000 mL | PREFILLED_SYRINGE | INTRAMUSCULAR | Status: AC
  Administered 2018-04-16: 0.5 mL via INTRAMUSCULAR
  Filled 2018-04-14: qty 0.5

## 2018-04-14 NOTE — Care Management Obs Status (Signed)
[place] NOTIFICATION   Patient Details  Name: AMARO MANGOLD MRN: [id number] Date of Birth: [birth year]   Medicare Observation Status Notification Given:  Yes    Elza Rafter, RN 04/14/2018, 2:55 PM

## 2018-04-14 NOTE — Consult Note (Addendum)
[facility] Cardiology  CARDIOLOGY CONSULT NOTE  Patient ID: Timothy Mcmahon MRN: [id number] DOB/AGE: [birth year] 76 y.o.  Admit date: 04/13/2018 Referring Physician Timothy Leber, MD Primary Physician Timothy Saunas, MD Primary Cardiologist Timothy Alexandria, MD Reason for Consultation Pre-syncope  HPI:  Timothy Mcmahon is a 78 y.o. with a past medical history of coronary artery disease s/p PCI with stent to mid LAD 03/18, peripheral vascular disease s/p left SFA stenting, and carotid artery stenosis s/p endarterectomy on left side with TCAR on right, COPD, who presented to the ED yesterday morning after having two near syncopal episodes at work. He reports walking when abruptly becoming lightheaded with tunnel vision. He did not lose consciousness or fall as his coworker caught him. He has had several of these lightheadedness and near syncopal episodes in the last few weeks. He denies having any associated palpitations, chest pain, or other symptoms along with the pre-syncope. Does report to frequent leg pain in the last few days. Pain occurs after walking 50-100 feet and is described as a burning sensation in both of his legs. This pain is similar to pain he felt prior to needing stents in his left leg. He has not seen his vascular surgeon in over one year. Has been unable to have his normal follow up due to appointment limitations from coronavirus.  He has an implantable cardiac loop monitor device by [employer]. His reports are followed by his cardiologist, Dr. Dwyane Mcmahon with [facility]. I spoke to Dr. Dwyane Mcmahon, who reports the patient has had several asymptomatic pauses, lasting approximately 3 seconds, recorded on his loop monitor. The last pause was recorded was on 04/10/18. During the patient's symptomatic episodes, his rhythm has been sinus without any events noted. Events are sent in via a home device every night at midnight, so any events that may have occurred yesterday morning along with the patient's symptoms  have not been transcribed as the patient has been away from his home device last night.   Review of systems complete and found to be negative unless listed above   Past Medical History:  Diagnosis Date  . CAD (coronary artery disease)   . COPD (chronic obstructive pulmonary disease) ([place])   . Hyperlipidemia   . Peripheral vascular disease ([place])     History reviewed. No pertinent surgical history.  Medications Prior to Admission  Medication Sig Dispense Refill Last Dose  . albuterol (PROVENTIL HFA;VENTOLIN HFA) 108 (90 Base) MCG/ACT inhaler Inhale 1-2 puffs into the lungs every 6 (six) hours as needed for wheezing.   prn at prn  . albuterol (PROVENTIL) (2.5 MG/3ML) 0.083% nebulizer solution Take 3 mLs by nebulization 2 (two) times daily.   prn at prn  . atorvastatin (LIPITOR) 80 MG tablet Take 80 mg by mouth daily.   04/13/2018 at 0800  . budesonide-formoterol (SYMBICORT) 160-4.5 MCG/ACT inhaler Inhale 2 puffs into the lungs 2 (two) times daily.   04/13/2018 at 0800  . clopidogrel (PLAVIX) 75 MG tablet Take 75 mg by mouth daily.   04/13/2018 at 0800  . ibuprofen (ADVIL,MOTRIN) 200 MG tablet Take 600 mg by mouth 2 (two) times daily.   04/13/2018 at 0800  . lisinopril (PRINIVIL,ZESTRIL) 5 MG tablet Take 5 mg by mouth daily.   04/13/2018 at 0800  . metoprolol tartrate (LOPRESSOR) 25 MG tablet Take 25 mg by mouth 2 (two) times daily.   04/13/2018 at 0800  . omeprazole (PRILOSEC) 20 MG capsule Take 20 mg by mouth daily.   04/13/2018 at 0800  .  Sodium Chloride, Inhalant, 7 % NEBU Take 4 mLs by nebulization 2 (two) times daily.   prn at prn  . tamsulosin (FLOMAX) 0.4 MG CAPS capsule Take 0.4 mg by mouth daily.   04/13/2018 at 0800  . Tiotropium Bromide Monohydrate (SPIRIVA RESPIMAT) 2.5 MCG/ACT AERS Inhale 2 puffs into the lungs daily.   04/13/2018 at 0800  . traZODone (DESYREL) 100 MG tablet Take 100 mg by mouth at bedtime.   04/12/2018 at [place] History  . Marital  status: Married    Spouse name: Not on file  . Number of children: Not on file  . Years of education: Not on file  . Highest education level: Not on file  Occupational History  . Not on file  Social Needs  . Financial resource strain: Not on file  . Food insecurity:    Worry: Not on file    Inability: Not on file  . Transportation needs:    Medical: Not on file    Non-medical: Not on file  Tobacco Use  . Smoking status: Current Every Day Smoker    Packs/day: 0.75    Years: 52.00    Pack years: 39.00    Types: Cigarettes  Substance and Sexual Activity  . Alcohol use: Not Currently  . Drug use: Not Currently  . Sexual activity: Not on file  Lifestyle  . Physical activity:    Days per week: Not on file    Minutes per session: Not on file  . Stress: Not on file  Relationships  . Social connections:    Talks on phone: Not on file    Gets together: Not on file    Attends religious service: Not on file    Active member of club or organization: Not on file    Attends meetings of clubs or organizations: Not on file    Relationship status: Not on file  . Intimate partner violence:    Fear of current or ex partner: Not on file    Emotionally abused: Not on file    Physically abused: Not on file    Forced sexual activity: Not on file  Other Topics Concern  . Not on file  Social History Narrative  . Not on file    Family History  Problem Relation Age of Onset  . Heart attack Mother   . Cancer Brother       Review of systems complete and found to be negative unless listed above    PHYSICAL EXAM  General: Well developed, well nourished, in no acute distress HEENT:  Normocephalic and atramatic Neck:  No JVD.  Lungs: Breathing comfortably on room air. Diffuse wheezing heard throughout. No crackles.  Heart: HRRR . Normal S1 and S2 without gallops or murmurs.  Abdomen: Bowel sounds are positive, abdomen soft and non-tender  Msk: Normal tone for age. Extremities: No  clubbing, cyanosis or edema.   Neuro: Alert and oriented X 3. Psych:  Good affect, responds appropriately  Labs:   Lab Results  Component Value Date   WBC 9.4 04/13/2018   HGB 12.7 (L) 04/13/2018   HCT 38.6 (L) 04/13/2018   MCV 96.5 04/13/2018   PLT 142 (L) 04/13/2018    Recent Labs  Lab 04/13/18 1105  NA 138  K 3.0*  CL 107  CO2 21*  BUN 23  CREATININE 1.40*  CALCIUM 8.5*  PROT 6.6  BILITOT 0.6  ALKPHOS 92  ALT 16  AST 18  GLUCOSE 105*   Lab Results  Component Value Date   TROPONINI 0.03 (HH) 04/14/2018   No results found for: CHOL No results found for: HDL No results found for: LDLCALC No results found for: TRIG No results found for: CHOLHDL No results found for: LDLDIRECT    Radiology: Ct Angio Head W Or Wo Contrast  Result Date: 04/13/2018 CLINICAL DATA:  Persistent central vertigo. EXAM: CT ANGIOGRAPHY HEAD AND NECK TECHNIQUE: Multidetector CT imaging of the head and neck was performed using the standard protocol during bolus administration of intravenous contrast. Multiplanar CT image reconstructions and MIPs were obtained to evaluate the vascular anatomy. Carotid stenosis measurements (when applicable) are obtained utilizing NASCET criteria, using the distal internal carotid diameter as the denominator. CONTRAST:  67mL OMNIPAQUE IOHEXOL 350 MG/ML SOLN COMPARISON:  CTA head 05/01/2016. FINDINGS: CT HEAD FINDINGS Brain: No evidence of acute infarction, hemorrhage, hydrocephalus, extra-axial collection or mass lesion/mass effect. Vascular: Reported separately. Skull: Normal. Negative for fracture or focal lesion. Sinuses: Imaged portions are clear. Orbits: No acute finding. Review of the MIP images confirms the above findings CTA NECK FINDINGS Aortic arch: Variant branching, LEFT vertebral arises directly from arch imaged portion shows no evidence of aneurysm or dissection. No significant stenosis of the innominate, common carotid, or RIGHT subclavian origins. 75-90%  stenosis proximal LEFT subclavian. Aortic atherosclerosis. Right carotid system: Minor atheromatous change RIGHT common carotid origin. RIGHT carotid stent extends from the common carotid artery across the bifurcation into the proximal ICA. IntraStent stenosis in the proximal RIGHT ICA, of 50% based on luminal diameter of 2.0/4.0 proximal/distal. See series 8, image 102. Left carotid system: No evidence of dissection, stenosis (50% or greater) or occlusion, status post LEFT carotid endarterectomy. Vertebral arteries: 50% ostial stenosis on the RIGHT. No ostial stenosis on the LEFT. Vertebrals are patent throughout the neck, with a RIGHT mildly dominant. Skeleton: No worrisome osseous lesion.  C6-C7 fusion. Other neck: No masses. Upper chest: Azygos lobe. Scarring and emphysema. Fluid and calcification in the azygos fissure. Review of the MIP images confirms the above findings CTA HEAD FINDINGS Anterior circulation: Calcification of the cavernous internal carotid arteries consistent with cerebrovascular atherosclerotic disease. Estimated BILATERAL calcific siphon stenoses are 50-75%, stable from priors. No signs of intracranial large vessel occlusion. Posterior circulation: RIGHT vertebral dominant. LEFT vertebral supplies PICA, with rudimentary basilar connection. There is at estimated 50% stenosis of the proximal basilar. Distal basilar appears normal caliber. No cerebellar branch occlusion. Venous sinuses: As permitted by contrast timing, patent. Anatomic variants: None of significance. Delayed phase: No abnormal postcontrast enhancement. Review of the MIP images confirms the above findings IMPRESSION: Status post LEFT carotid endarterectomy, widely patent. IntraStent stenosis, proximal RIGHT ICA, 50%. 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 Non dominant LEFT vertebral, arising from the arch, widely patent. 50% stenosis  dominant RIGHT vertebral, stable from  priors. 50% stenosis proximal basilar, stable from priors. 50-75% calcific stenosis both carotid siphons, stable from priors. Electronically Signed   By: Staci Righter M.D.   On: 04/13/2018 13:20   Dg Chest 2 View  Result Date: 04/13/2018 CLINICAL DATA:  Orthostatic hypotension. EXAM: CHEST - 2 VIEW COMPARISON:  CT chest dated July 04, 2017. Chest x-ray dated May 01, 2016. FINDINGS: New loop recorder. The heart size and mediastinal contours are within normal limits. Normal pulmonary vascularity. 8 mm nodular density in the left upper lobe appears slightly increased when compared to prior chest CT. Minimal left basilar atelectasis. No focal  consolidation, pleural effusion, or pneumothorax. No acute osseous abnormality. IMPRESSION: 1. 8 mm nodular density in the left upper lobe appears slightly increased when compared to prior chest CT. Further evaluation with non-emergent chest CT is recommended. 2.  No active cardiopulmonary disease. Electronically Signed   By: Titus Dubin M.D.   On: 04/13/2018 12:35   Ct Angio Neck W And/or Wo Contrast  Result Date: 04/13/2018 CLINICAL DATA:  Persistent central vertigo. EXAM: CT ANGIOGRAPHY HEAD AND NECK TECHNIQUE: Multidetector CT imaging of the head and neck was performed using the standard protocol during bolus administration of intravenous contrast. Multiplanar CT image reconstructions and MIPs were obtained to evaluate the vascular anatomy. Carotid stenosis measurements (when applicable) are obtained utilizing NASCET criteria, using the distal internal carotid diameter as the denominator. CONTRAST:  45mL OMNIPAQUE IOHEXOL 350 MG/ML SOLN COMPARISON:  CTA head 05/01/2016. FINDINGS: CT HEAD FINDINGS Brain: No evidence of acute infarction, hemorrhage, hydrocephalus, extra-axial collection or mass lesion/mass effect. Vascular: Reported separately. Skull: Normal. Negative for fracture or focal lesion. Sinuses: Imaged portions are clear. Orbits: No acute finding. Review  of the MIP images confirms the above findings CTA NECK FINDINGS Aortic arch: Variant branching, LEFT vertebral arises directly from arch imaged portion shows no evidence of aneurysm or dissection. No significant stenosis of the innominate, common carotid, or RIGHT subclavian origins. 75-90% stenosis proximal LEFT subclavian. Aortic atherosclerosis. Right carotid system: Minor atheromatous change RIGHT common carotid origin. RIGHT carotid stent extends from the common carotid artery across the bifurcation into the proximal ICA. IntraStent stenosis in the proximal RIGHT ICA, of 50% based on luminal diameter of 2.0/4.0 proximal/distal. See series 8, image 102. Left carotid system: No evidence of dissection, stenosis (50% or greater) or occlusion, status post LEFT carotid endarterectomy. Vertebral arteries: 50% ostial stenosis on the RIGHT. No ostial stenosis on the LEFT. Vertebrals are patent throughout the neck, with a RIGHT mildly dominant. Skeleton: No worrisome osseous lesion.  C6-C7 fusion. Other neck: No masses. Upper chest: Azygos lobe. Scarring and emphysema. Fluid and calcification in the azygos fissure. Review of the MIP images confirms the above findings CTA HEAD FINDINGS Anterior circulation: Calcification of the cavernous internal carotid arteries consistent with cerebrovascular atherosclerotic disease. Estimated BILATERAL calcific siphon stenoses are 50-75%, stable from priors. No signs of intracranial large vessel occlusion. Posterior circulation: RIGHT vertebral dominant. LEFT vertebral supplies PICA, with rudimentary basilar connection. There is at estimated 50% stenosis of the proximal basilar. Distal basilar appears normal caliber. No cerebellar branch occlusion. Venous sinuses: As permitted by contrast timing, patent. Anatomic variants: None of significance. Delayed phase: No abnormal postcontrast enhancement. Review of the MIP images confirms the above findings IMPRESSION: Status post LEFT carotid  endarterectomy, widely patent. IntraStent stenosis, proximal RIGHT ICA, 50%. 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 Non dominant LEFT vertebral, arising from the arch, widely patent. 50% stenosis  dominant RIGHT vertebral, stable from priors. 50% stenosis proximal basilar, stable from priors. 50-75% calcific stenosis both carotid siphons, stable from priors. Electronically Signed   By: Staci Righter M.D.   On: 04/13/2018 13:20   ASSESSMENT AND PLAN:  Mr. Seung is a 78 year old male with a history of CAD s/p PCI and stent to LAD in 2018, peripheral vascular disease with left SFA stenting, and left carotid endarterectomy, who presents after having a near syncopal episodes. No true syncope. Symptoms not associated with palpitations, chest pain, or other concerning symptoms. Have been unable to obtain implantable loop monitoring  device report due to patient being away from his bedside device last night. Will attempt to contact [employer] rep for inpatient interrogation, however, patient has no evidence of an acute cardiac event that would require further emergent evaluation at this time. Telemetry review without events during his hospitalization. No evidence of heart failure or myocardial ischemia. Troponin x3 negative. CXR without acute pathology. Would not recommend any further cardiac diagnostics at this time. Would recommend close outpatient cardiology follow up for further evaluation.   Signed: Hilbert Odor PA-C 04/14/2018, 8:31 AM   Agree with above Serafina Royals md facc

## 2018-04-14 NOTE — Progress Notes (Signed)
[employer] rep just finished interrogating the patient's implantable loop device. Patient had a four second pause and a subsequent 2.5 secnod pause yesterday morning that correlated with his symptoms. Given new confirmed symptomatic sinus pauses, patient will need dual chamber pacemaker placement. He received a dose of Plavix this morning. Will hold plavix for five days and he will be scheduled for outpatient pacemaker placement on 04/20/2018 with [facility]. Will discuss pacemaker placement and obtain consent tomorrow AM. Interrogation report to be  scanned into the system.   Hilbert Odor, PA-C

## 2018-04-14 NOTE — Progress Notes (Signed)
[place] [facility] [place] NAME: Timothy Mcmahon    MR#:  [id number]  DATE OF BIRTH:  02-06-1940  SUBJECTIVE:  CHIEF COMPLAINT:   Chief Complaint  Patient presents with  . Near Syncope   Patient noted to have had fever with temperature of 100.5 last night.  Having intermittent cough which he reports is chronic for over a month.  Denies any shortness of breath.  No chest pain.  Seen by cardiologist this morning.  REVIEW OF SYSTEMS:  Review of Systems  Constitutional: Positive for fever. Negative for chills.  HENT: Negative for hearing loss and tinnitus.   Eyes: Negative for blurred vision.  Respiratory: Positive for cough. Negative for sputum production.   Cardiovascular: Negative for chest pain.  Gastrointestinal: Negative for heartburn, nausea and vomiting.  Genitourinary: Negative for dysuria and urgency.  Musculoskeletal: Negative for myalgias and neck pain.  Skin: Negative for itching and rash.  Neurological: Negative for dizziness and headaches.  Psychiatric/Behavioral: Negative for depression and hallucinations.    DRUG ALLERGIES:   Allergies  Allergen Reactions  . Aspirin Nausea And Vomiting    With high dose ASA only   VITALS:  Blood pressure (!) 102/56, pulse 97, temperature 99.2 F (37.3 C), temperature source Oral, resp. rate 20, height 5\' 11"  (1.803 m), weight 71.7 kg, SpO2 93 %. PHYSICAL EXAMINATION:   Physical Exam  Constitutional: He is oriented to person, place, and time. He appears well-developed and well-nourished.  HENT:  Head: Normocephalic and atraumatic.  Right Ear: External ear normal.  Eyes: Pupils are equal, round, and reactive to light. Conjunctivae are normal. Right eye exhibits no discharge.  Neck: Normal range of motion. Neck supple. No thyromegaly present.  Cardiovascular: Normal rate, regular rhythm and normal heart sounds.  Respiratory: Effort normal and breath sounds normal. He has no wheezes.   GI: Soft. Bowel sounds are normal. He exhibits no distension.  Musculoskeletal: Normal range of motion.        General: No edema.  Neurological: He is alert and oriented to person, place, and time. No cranial nerve deficit.  Skin: Skin is warm. He is not diaphoretic. No erythema.  Psychiatric: He has a normal mood and affect. His behavior is normal.   LABORATORY PANEL:  Male CBC Recent Labs  Lab 04/13/18 1105  WBC 9.4  HGB 12.7*  HCT 38.6*  PLT 142*   ------------------------------------------------------------------------------------------------------------------ Chemistries  Recent Labs  Lab 04/13/18 1105  NA 138  K 3.0*  CL 107  CO2 21*  GLUCOSE 105*  BUN 23  CREATININE 1.40*  CALCIUM 8.5*  AST 18  ALT 16  ALKPHOS 92  BILITOT 0.6   RADIOLOGY:  No results found. ASSESSMENT AND PLAN:   1.  Recurrent presyncope-  Source unclear but suspected be cardiogenic in nature.  Patient's CT of the head neck shows no acute pathology.  Patient has a previous history of carotid artery stenosis with stable left-sided carotid endarterectomy which is widely patent.-Right ICA has 50% stenosis.  He has no neurologic symptoms.  Patient follows up with vascular surgery service at [facility].  Most of the findings on CAT scan are chronic and not new for which he follows up with vascular surgeon at [facility] as confirmed by daughter; Timothy Mcmahon -Patient has a Linq/Loop monitor on his chest which was implanted about a month ago at [facility].  Patient seen by cardiologist.  No evidence of acute events on telemetry so far.  Troponin negative x3.  Cardiologist does not recommend any further cardiac diagnostics at this time.  Recommend outpatient follow-up with cardiology for further evaluation Patient appears to have had evidence of orthostatic hypotension on presentation.  Continue gentle IV fluid hydration  2.  Hypokalemia Replaced.  Follow-up on repeat levels in a.m.   3.  Acute kidney injury- Continue IV fluid  hydration.  Follow-up on BMP in a.m.  Hold ACE inhibitor for now.  4.  Essential hypertension-continue metoprolol.  Hold lisinopril for now.  5.  BPH-continue Flomax.  No urine retention.  6.  GERD-continue Protonix.  7.  History of peripheral vascular disease-continue Plavix, atorvastatin.  8.  COPD-no acute exacerbation.  Continue Dulera, Spiriva and albuterol nebulizers as needed.  9.  Fever last night Patient had a temperature 100.5.  Having intermittent cough.  Chest x-ray from last night with no acute infiltrate apart from 8 mm left upper lobe nodule.  Recommendation was for follow-up CT chest in 3 months to ensure stability. Placed empirically on antibiotics with Unasyn given fevers with cough. Requested for influenza test which was negative.  Follow-up on respiratory panel when read.  DVT prophylaxis;  I called and updated patient's daughter Ms. Timothy Mcmahon; [phone] and updated her on treatment plans as outlined above.  All questions were answered.     All the records are reviewed and case discussed with Care Management/Social Worker. Management plans discussed with the patient, family and they are in agreement.  CODE STATUS: Full Code  TOTAL TIME TAKING CARE OF THIS PATIENT: 27 minutes.   More than 50% of the time was spent in counseling/coordination of care: YES  POSSIBLE D/C IN 1-2 DAYS, DEPENDING ON CLINICAL CONDITION.   Timothy Mcmahon M.D on 04/14/2018 at 2:17 PM  Between 7am to 6pm - Pager - [phone]  After 6pm go to [email or web address] - Proofreader  [facility]  Office  [phone]  CC: Primary care physician; Coy Saunas, MD  Note: This dictation was prepared with Dragon dictation along with smaller phrase technology. Any transcriptional errors that result from this process are unintentional.

## 2018-04-14 NOTE — Progress Notes (Signed)
Patient reports that he had 3 loose stools today since breakfast and the last time it was quite urgent. Dr. Stark Jock was notified. Placed "hat" in toilet and requested that patient collect stool for nurse to observe if this occurs again. Pt verbalized understanding. Patient was also noted by this RN to be SOB on exertion, with purse lip breathing when OOB to BR. O2 sat was checked on RA, noted to be 96%. Dr. Stark Jock was also notified of this. Patient reports back to baseline prior to RN leaving the room.

## 2018-04-15 DIAGNOSIS — Y939 Activity, unspecified: Secondary | ICD-10-CM | POA: Diagnosis not present

## 2018-04-15 DIAGNOSIS — R55 Syncope and collapse: Secondary | ICD-10-CM | POA: Diagnosis present

## 2018-04-15 DIAGNOSIS — I10 Essential (primary) hypertension: Secondary | ICD-10-CM | POA: Diagnosis present

## 2018-04-15 DIAGNOSIS — S51012A Laceration without foreign body of left elbow, initial encounter: Secondary | ICD-10-CM | POA: Diagnosis present

## 2018-04-15 DIAGNOSIS — K219 Gastro-esophageal reflux disease without esophagitis: Secondary | ICD-10-CM | POA: Diagnosis present

## 2018-04-15 DIAGNOSIS — Y929 Unspecified place or not applicable: Secondary | ICD-10-CM | POA: Diagnosis not present

## 2018-04-15 DIAGNOSIS — I440 Atrioventricular block, first degree: Secondary | ICD-10-CM | POA: Diagnosis present

## 2018-04-15 DIAGNOSIS — J189 Pneumonia, unspecified organism: Secondary | ICD-10-CM | POA: Diagnosis present

## 2018-04-15 DIAGNOSIS — T82856A Stenosis of peripheral vascular stent, initial encounter: Secondary | ICD-10-CM | POA: Diagnosis present

## 2018-04-15 DIAGNOSIS — D649 Anemia, unspecified: Secondary | ICD-10-CM | POA: Diagnosis present

## 2018-04-15 DIAGNOSIS — I495 Sick sinus syndrome: Secondary | ICD-10-CM | POA: Diagnosis present

## 2018-04-15 DIAGNOSIS — F17210 Nicotine dependence, cigarettes, uncomplicated: Secondary | ICD-10-CM | POA: Diagnosis present

## 2018-04-15 DIAGNOSIS — E876 Hypokalemia: Secondary | ICD-10-CM | POA: Diagnosis present

## 2018-04-15 DIAGNOSIS — I951 Orthostatic hypotension: Secondary | ICD-10-CM | POA: Diagnosis present

## 2018-04-15 DIAGNOSIS — Z955 Presence of coronary angioplasty implant and graft: Secondary | ICD-10-CM | POA: Diagnosis not present

## 2018-04-15 DIAGNOSIS — N179 Acute kidney failure, unspecified: Secondary | ICD-10-CM | POA: Diagnosis present

## 2018-04-15 DIAGNOSIS — Y990 Civilian activity done for income or pay: Secondary | ICD-10-CM | POA: Diagnosis not present

## 2018-04-15 DIAGNOSIS — W1839XA Other fall on same level, initial encounter: Secondary | ICD-10-CM | POA: Diagnosis not present

## 2018-04-15 DIAGNOSIS — R911 Solitary pulmonary nodule: Secondary | ICD-10-CM | POA: Diagnosis present

## 2018-04-15 DIAGNOSIS — Z20828 Contact with and (suspected) exposure to other viral communicable diseases: Secondary | ICD-10-CM | POA: Diagnosis present

## 2018-04-15 DIAGNOSIS — I739 Peripheral vascular disease, unspecified: Secondary | ICD-10-CM | POA: Diagnosis present

## 2018-04-15 DIAGNOSIS — N400 Enlarged prostate without lower urinary tract symptoms: Secondary | ICD-10-CM | POA: Diagnosis present

## 2018-04-15 DIAGNOSIS — J440 Chronic obstructive pulmonary disease with acute lower respiratory infection: Secondary | ICD-10-CM | POA: Diagnosis present

## 2018-04-15 DIAGNOSIS — E785 Hyperlipidemia, unspecified: Secondary | ICD-10-CM | POA: Diagnosis present

## 2018-04-15 DIAGNOSIS — Z23 Encounter for immunization: Secondary | ICD-10-CM | POA: Diagnosis present

## 2018-04-15 DIAGNOSIS — Z95820 Peripheral vascular angioplasty status with implants and grafts: Secondary | ICD-10-CM | POA: Diagnosis not present

## 2018-04-15 DIAGNOSIS — Z886 Allergy status to analgesic agent status: Secondary | ICD-10-CM | POA: Diagnosis not present

## 2018-04-15 DIAGNOSIS — I2510 Atherosclerotic heart disease of native coronary artery without angina pectoris: Secondary | ICD-10-CM | POA: Diagnosis present

## 2018-04-15 DIAGNOSIS — Z95818 Presence of other cardiac implants and grafts: Secondary | ICD-10-CM | POA: Diagnosis not present

## 2018-04-15 LAB — CBC
HCT: 34.2 % — ABNORMAL LOW (ref 39.0–52.0)
Hemoglobin: 11.4 g/dL — ABNORMAL LOW (ref 13.0–17.0)
MCH: 31.9 pg (ref 26.0–34.0)
MCHC: 33.3 g/dL (ref 30.0–36.0)
MCV: 95.8 fL (ref 80.0–100.0)
Platelets: 135 10*3/uL — ABNORMAL LOW (ref 150–400)
RBC: 3.57 MIL/uL — ABNORMAL LOW (ref 4.22–5.81)
RDW: 13.7 % (ref 11.5–15.5)
WBC: 6.1 10*3/uL (ref 4.0–10.5)
nRBC: 0 % (ref 0.0–0.2)

## 2018-04-15 LAB — BASIC METABOLIC PANEL
Anion gap: 5 (ref 5–15)
BUN: 14 mg/dL (ref 8–23)
CO2: 22 mmol/L (ref 22–32)
CREATININE: 0.88 mg/dL (ref 0.61–1.24)
Calcium: 8.4 mg/dL — ABNORMAL LOW (ref 8.9–10.3)
Chloride: 110 mmol/L (ref 98–111)
GFR calc Af Amer: >60 mL/min (ref >60)
GFR calc non Af Amer: >60 mL/min (ref >60)
Glucose, Bld: 122 mg/dL — ABNORMAL HIGH (ref 70–99)
Potassium: 3.4 mmol/L — ABNORMAL LOW (ref 3.5–5.1)
Sodium: 137 mmol/L (ref 135–145)

## 2018-04-15 LAB — MAGNESIUM: Magnesium: 1.8 mg/dL (ref 1.7–2.4)

## 2018-04-15 MED ORDER — SODIUM CHLORIDE 0.9 % IV SOLN
INTRAVENOUS | Status: DC | PRN
  Administered 2018-04-15: 250 mL via INTRAVENOUS

## 2018-04-15 MED ORDER — SODIUM CHLORIDE 0.9 % IV SOLN
1.0000 g | INTRAVENOUS | Status: DC
  Administered 2018-04-15 – 2018-04-16 (×2): 1 g via INTRAVENOUS
  Filled 2018-04-15: qty 10
  Filled 2018-04-15 (×2): qty 1

## 2018-04-15 MED ORDER — SODIUM CHLORIDE 0.9 % IV SOLN
500.0000 mg | INTRAVENOUS | Status: DC
  Administered 2018-04-15: 500 mg via INTRAVENOUS
  Filled 2018-04-15 (×3): qty 500

## 2018-04-15 MED ORDER — POTASSIUM CHLORIDE CRYS ER 20 MEQ PO TBCR
40.0000 meq | EXTENDED_RELEASE_TABLET | Freq: Once | ORAL | Status: AC
  Administered 2018-04-15: 40 meq via ORAL
  Filled 2018-04-15: qty 2

## 2018-04-15 MED ORDER — ENOXAPARIN SODIUM 40 MG/0.4ML ~~LOC~~ SOLN
40.0000 mg | SUBCUTANEOUS | Status: DC
  Administered 2018-04-15: 40 mg via SUBCUTANEOUS
  Filled 2018-04-15: qty 0.4

## 2018-04-15 NOTE — Plan of Care (Signed)
  Problem: Education: Goal: Knowledge of General Education information will improve Description Including pain rating scale, medication(s)/side effects and non-pharmacologic comfort measures Outcome: Progressing   Problem: Health Behavior/Discharge Planning: Goal: Ability to manage health-related needs will improve Outcome: Progressing   Problem: Clinical Measurements: Goal: Ability to maintain clinical measurements within normal limits will improve Outcome: Progressing Goal: Will remain free from infection Outcome: Progressing Goal: Diagnostic test results will improve Outcome: Progressing Goal: Respiratory complications will improve Outcome: Progressing Goal: Cardiovascular complication will be avoided Outcome: Progressing   Problem: Activity: Goal: Risk for activity intolerance will decrease Outcome: Progressing   Problem: Nutrition: Goal: Adequate nutrition will be maintained Outcome: Progressing   Problem: Coping: Goal: Level of anxiety will decrease Outcome: Progressing   Problem: Elimination: Goal: Will not experience complications related to bowel motility Outcome: Progressing Goal: Will not experience complications related to urinary retention Outcome: Progressing   Problem: Pain Managment: Goal: General experience of comfort will improve Outcome: Progressing   Problem: Safety: Goal: Ability to remain free from injury will improve Outcome: Progressing   Problem: Skin Integrity: Goal: Risk for impaired skin integrity will decrease Outcome: Progressing   Problem: Respiratory: Goal: Ability to maintain adequate ventilation will improve Outcome: Progressing Goal: Ability to maintain a clear airway will improve Outcome: Progressing

## 2018-04-15 NOTE — Progress Notes (Signed)
Patient refusing bed alarm because he wants to be able to sit on edge of bed to use urinal without alarm going off. Educated on safety. Agrees to call before getting out of bed.

## 2018-04-15 NOTE — Plan of Care (Signed)
No complaints of dizziness during position change. Orthostatics were unremarkable.

## 2018-04-15 NOTE — Progress Notes (Signed)
[facility] [place] NAME: Timothy Mcmahon    MR#:  [id number]  DATE OF BIRTH:  Nov 26, 1940  SUBJECTIVE:  CHIEF COMPLAINT:   Chief Complaint  Patient presents with  . Near Syncope   Patient had a fever with temperature of 101 yesterday evening.  Having intermittent productive cough.  Influenza test negative.  Respiratory virus panel negative.  Requested for covid virus testing.  Currently in isolation with droplet and contact precautions. No fevers overnight.  Feeling better this morning.   REVIEW OF SYSTEMS:  Review of Systems  Constitutional: Positive for fever. Negative for chills.  HENT: Negative for hearing loss and tinnitus.   Eyes: Negative for blurred vision.  Respiratory: Positive for cough. Negative for sputum production.   Cardiovascular: Negative for chest pain.  Gastrointestinal: Negative for heartburn, nausea and vomiting.  Genitourinary: Negative for dysuria and urgency.  Musculoskeletal: Negative for myalgias and neck pain.  Skin: Negative for itching and rash.  Neurological: Negative for dizziness and headaches.  Psychiatric/Behavioral: Negative for depression and hallucinations.    DRUG ALLERGIES:   Allergies  Allergen Reactions  . Aspirin Nausea And Vomiting    With high dose ASA only   VITALS:  Blood pressure 121/63, pulse 84, temperature 98.9 F (37.2 C), temperature source Oral, resp. rate 19, height 5\' 11"  (1.803 m), weight 69.2 kg, SpO2 95 %. PHYSICAL EXAMINATION:   Physical Exam  Constitutional: He is oriented to person, place, and time. He appears well-developed and well-nourished.  HENT:  Head: Normocephalic and atraumatic.  Right Ear: External ear normal.  Eyes: Pupils are equal, round, and reactive to light. Conjunctivae are normal. Right eye exhibits no discharge.  Neck: Normal range of motion. Neck supple. No thyromegaly present.  Cardiovascular: Normal rate, regular rhythm and normal heart  sounds.  Respiratory: Effort normal and breath sounds normal. He has no wheezes.  GI: Soft. Bowel sounds are normal. He exhibits no distension.  Musculoskeletal: Normal range of motion.        General: No edema.  Neurological: He is alert and oriented to person, place, and time. No cranial nerve deficit.  Skin: Skin is warm. He is not diaphoretic. No erythema.  Psychiatric: He has a normal mood and affect. His behavior is normal.   LABORATORY PANEL:  Male CBC Recent Labs  Lab 04/15/18 0639  WBC 6.1  HGB 11.4*  HCT 34.2*  PLT 135*   ------------------------------------------------------------------------------------------------------------------ Chemistries  Recent Labs  Lab 04/13/18 1105 04/15/18 0639  NA 138 137  K 3.0* 3.4*  CL 107 110  CO2 21* 22  GLUCOSE 105* 122*  BUN 23 14  CREATININE 1.40* 0.88  CALCIUM 8.5* 8.4*  MG  --  1.8  AST 18  --   ALT 16  --   ALKPHOS 92  --   BILITOT 0.6  --    RADIOLOGY:  No results found. ASSESSMENT AND PLAN:   1.  Recurrent presyncope-  Etiology is cardiac. Patient seen by cardiologist.. 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 He will need a dual chamber pacemaker. Patient has remained in sinus rhythm during admission. He has been asymptomatic throughout his stay. Safe for discharge home with outpatient pacemaker placement when medically stable.  Patient scheduled for pacemaker placement for Monday 04/20/2018 with Dr. Saralyn Pilar.  Last received dose of plavix on 04/14/2018. Will need to hold plavix for 5  days prior surgery.  No further cardiac diagnostics or interventions required at this time CT of the head neck shows no acute pathology.  Patient has a previous history of carotid artery stenosis with stable left-sided carotid endarterectomy which is widely patent.-Right ICA has 50% stenosis.  He has no  neurologic symptoms.  Patient follows up with vascular surgery service at [facility].  Most of the findings on CAT scan are chronic and not new for which he follows up with vascular surgeon at [facility] as confirmed by daughter; [name] -Patient has a Linq/Loop monitor on his chest which was implanted about a month ago at [facility].  Patient seen by cardiologist.  No evidence of acute events on telemetry so far.  Troponin negative x3.   Patient appears to have had evidence of orthostatic hypotension on presentation.  Patient was hydrated with IV fluids   2.  Hypokalemia Replaced.  Follow-up on repeat levels in a.m.   3.  Acute kidney injury- Resolved with IV fluid hydration.  Will resume ACE inhibitor in a.m.  4.  Essential hypertension-continue metoprolol.  Hold lisinopril for now.  5.  BPH-continue Flomax.  No urine retention.  6.  GERD-continue Protonix.  7.  History of peripheral vascular disease-continue Plavix, atorvastatin.  8.  COPD-no acute exacerbation.  Continue Dulera, Spiriva and albuterol nebulizers as needed.  9.  Fevers with cough  Patient had temperature of 101 yesterday evening.  No fevers overnight.  Patient's chest x-ray with no acute infiltrate apart from 8 mm left upper lobe nodule.  Recommendation was for follow-up CT chest in 3 months to ensure stability. Influenza test negative.  Respiratory virus panel negative. Concern for possible covid virus infection.  Test has been requested and patient placed on droplet and contact isolation.  Patient placed empirically on antibiotics initially with Unasyn and later changed to Rocephin plus azithromycin to cover for possible community-acquired pneumonia which did not show up on initial chest x-ray  DVT prophylaxis; patient on Lovenox  I called and updated patient's daughter Ms. [name]; [phone] and updated her on treatment plans as outlined above.  All questions were answered.     All the records are reviewed and case discussed with  Care Management/Social Worker. Management plans discussed with the patient, family and they are in agreement.  CODE STATUS: Full Code  TOTAL TIME TAKING CARE OF THIS PATIENT: 34 minutes.   More than 50% of the time was spent in counseling/coordination of care: YES  POSSIBLE D/C IN 1-2 DAYS, DEPENDING ON CLINICAL CONDITION.   Aslin Farinas M.D on 04/15/2018 at 12:24 PM  Between 7am to 6pm - Pager - [phone]  After 6pm go to [email or web address] - Proofreader  [facility]  Office  [phone]  CC: Primary care physician; Coy Saunas, MD  Note: This dictation was prepared with Dragon dictation along with smaller phrase technology. Any transcriptional errors that result from this process are unintentional.

## 2018-04-15 NOTE — Progress Notes (Signed)
[facility] Cardiology  SUBJECTIVE:   Patient reports doing well from a cardiac standpoint. He's had no lightheadedness, dizziness, chest pain, palpitations, shortness of breath, or other cardiovascular symptoms.   Interval history is significant for patient developing a fever with Tmax of 100.5 yesterday. Given patient's report of recent cough, he was placed on isolation precautions and tested for novel coronavirus. Results in process at this time.   Discussed the need for a pacemaker placement after reviewing his implantable loop device interrogation report. Four second pause associated with symptoms consistent with sinoatrial node dysfunction. Patient expresses understanding and agrees to the plan of care.    Vitals:   04/15/18 0801 04/15/18 0803 04/15/18 0804 04/15/18 0806  BP: (!) 154/74 140/76 123/63 121/63  Pulse: 78 78 88 84  Resp: 19     Temp: 98.9 F (37.2 C)     TempSrc: Oral     SpO2: 95%     Weight:      Height:         Intake/Output Summary (Last 24 hours) at 04/15/2018 1029 Last data filed at 04/15/2018 0802 Gross per 24 hour  Intake 1633.56 ml  Output 1425 ml  Net 208.56 ml      PHYSICAL EXAM  General: Well developed, well nourished, in no acute distress HEENT:  Normocephalic and atramatic Neck:  No JVD.  Lungs: Breathing comfortably without any increased work of breathing.  Heart: Regular rate and rhythm.  Neuro: Alert and oriented X 3. Psych:  Good affect, responds appropriately  LABS: Basic Metabolic Panel: Recent Labs    04/13/18 1105 04/15/18 0639  NA 138 137  K 3.0* 3.4*  CL 107 110  CO2 21* 22  GLUCOSE 105* 122*  BUN 23 14  CREATININE 1.40* 0.88  CALCIUM 8.5* 8.4*  MG  --  1.8   Liver Function Tests: Recent Labs    04/13/18 1105  AST 18  ALT 16  ALKPHOS 92  BILITOT 0.6  PROT 6.6  ALBUMIN 3.3*   No results for input(s): LIPASE, AMYLASE in the last 72 hours. CBC: Recent Labs    04/13/18 1105 04/15/18 0639  WBC 9.4 6.1  HGB 12.7*  11.4*  HCT 38.6* 34.2*  MCV 96.5 95.8  PLT 142* 135*   Cardiac Enzymes: Recent Labs    04/13/18 2224 04/14/18 0232 04/14/18 0613  TROPONINI <0.03 <0.03 0.03*   BNP: Invalid input(s): POCBNP D-Dimer: No results for input(s): DDIMER in the last 72 hours. Hemoglobin A1C: No results for input(s): HGBA1C in the last 72 hours. Fasting Lipid Panel: No results for input(s): CHOL, HDL, LDLCALC, TRIG, CHOLHDL, LDLDIRECT in the last 72 hours. Thyroid Function Tests: No results for input(s): TSH, T4TOTAL, T3FREE, THYROIDAB in the last 72 hours.  Invalid input(s): FREET3 Anemia Panel: No results for input(s): VITAMINB12, FOLATE, FERRITIN, TIBC, IRON, RETICCTPCT in the last 72 hours.  Ct Angio Head W Or Wo Contrast  Result Date: 04/13/2018 CLINICAL DATA:  Persistent central vertigo. EXAM: CT ANGIOGRAPHY HEAD AND NECK TECHNIQUE: Multidetector CT imaging of the head and neck was performed using the standard protocol during bolus administration of intravenous contrast. Multiplanar CT image reconstructions and MIPs were obtained to evaluate the vascular anatomy. Carotid stenosis measurements (when applicable) are obtained utilizing NASCET criteria, using the distal internal carotid diameter as the denominator. CONTRAST:  7mL OMNIPAQUE IOHEXOL 350 MG/ML SOLN COMPARISON:  CTA head 05/01/2016. FINDINGS: CT HEAD FINDINGS Brain: No evidence of acute infarction, hemorrhage, hydrocephalus, extra-axial collection or mass lesion/mass effect. Vascular:  Reported separately. Skull: Normal. Negative for fracture or focal lesion. Sinuses: Imaged portions are clear. Orbits: No acute finding. Review of the MIP images confirms the above findings CTA NECK FINDINGS Aortic arch: Variant branching, LEFT vertebral arises directly from arch imaged portion shows no evidence of aneurysm or dissection. No significant stenosis of the innominate, common carotid, or RIGHT subclavian origins. 75-90% stenosis proximal LEFT  subclavian. Aortic atherosclerosis. Right carotid system: Minor atheromatous change RIGHT common carotid origin. RIGHT carotid stent extends from the common carotid artery across the bifurcation into the proximal ICA. IntraStent stenosis in the proximal RIGHT ICA, of 50% based on luminal diameter of 2.0/4.0 proximal/distal. See series 8, image 102. Left carotid system: No evidence of dissection, stenosis (50% or greater) or occlusion, status post LEFT carotid endarterectomy. Vertebral arteries: 50% ostial stenosis on the RIGHT. No ostial stenosis on the LEFT. Vertebrals are patent throughout the neck, with a RIGHT mildly dominant. Skeleton: No worrisome osseous lesion.  C6-C7 fusion. Other neck: No masses. Upper chest: Azygos lobe. Scarring and emphysema. Fluid and calcification in the azygos fissure. Review of the MIP images confirms the above findings CTA HEAD FINDINGS Anterior circulation: Calcification of the cavernous internal carotid arteries consistent with cerebrovascular atherosclerotic disease. Estimated BILATERAL calcific siphon stenoses are 50-75%, stable from priors. No signs of intracranial large vessel occlusion. Posterior circulation: RIGHT vertebral dominant. LEFT vertebral supplies PICA, with rudimentary basilar connection. There is at estimated 50% stenosis of the proximal basilar. Distal basilar appears normal caliber. No cerebellar branch occlusion. Venous sinuses: As permitted by contrast timing, patent. Anatomic variants: None of significance. Delayed phase: No abnormal postcontrast enhancement. Review of the MIP images confirms the above findings IMPRESSION: Status post LEFT carotid endarterectomy, widely patent. IntraStent stenosis, proximal RIGHT ICA, 50%. 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 Non dominant LEFT vertebral, arising from the arch, widely patent. 50% stenosis  dominant RIGHT vertebral, stable from priors. 50% stenosis proximal  basilar, stable from priors. 50-75% calcific stenosis both carotid siphons, stable from priors. Electronically Signed   By: Staci Righter M.D.   On: 04/13/2018 13:20   Dg Chest 2 View  Result Date: 04/13/2018 CLINICAL DATA:  Orthostatic hypotension. EXAM: CHEST - 2 VIEW COMPARISON:  CT chest dated July 04, 2017. Chest x-ray dated May 01, 2016. FINDINGS: New loop recorder. The heart size and mediastinal contours are within normal limits. Normal pulmonary vascularity. 8 mm nodular density in the left upper lobe appears slightly increased when compared to prior chest CT. Minimal left basilar atelectasis. No focal consolidation, pleural effusion, or pneumothorax. No acute osseous abnormality. IMPRESSION: 1. 8 mm nodular density in the left upper lobe appears slightly increased when compared to prior chest CT. Further evaluation with non-emergent chest CT is recommended. 2.  No active cardiopulmonary disease. Electronically Signed   By: Titus Dubin M.D.   On: 04/13/2018 12:35   Ct Angio Neck W And/or Wo Contrast  Result Date: 04/13/2018 CLINICAL DATA:  Persistent central vertigo. EXAM: CT ANGIOGRAPHY HEAD AND NECK TECHNIQUE: Multidetector CT imaging of the head and neck was performed using the standard protocol during bolus administration of intravenous contrast. Multiplanar CT image reconstructions and MIPs were obtained to evaluate the vascular anatomy. Carotid stenosis measurements (when applicable) are obtained utilizing NASCET criteria, using the distal internal carotid diameter as the denominator. CONTRAST:  38mL OMNIPAQUE IOHEXOL 350 MG/ML SOLN COMPARISON:  CTA head 05/01/2016. FINDINGS: CT HEAD FINDINGS Brain: No evidence of acute infarction, hemorrhage, hydrocephalus,  extra-axial collection or mass lesion/mass effect. Vascular: Reported separately. Skull: Normal. Negative for fracture or focal lesion. Sinuses: Imaged portions are clear. Orbits: No acute finding. Review of the MIP images confirms  the above findings CTA NECK FINDINGS Aortic arch: Variant branching, LEFT vertebral arises directly from arch imaged portion shows no evidence of aneurysm or dissection. No significant stenosis of the innominate, common carotid, or RIGHT subclavian origins. 75-90% stenosis proximal LEFT subclavian. Aortic atherosclerosis. Right carotid system: Minor atheromatous change RIGHT common carotid origin. RIGHT carotid stent extends from the common carotid artery across the bifurcation into the proximal ICA. IntraStent stenosis in the proximal RIGHT ICA, of 50% based on luminal diameter of 2.0/4.0 proximal/distal. See series 8, image 102. Left carotid system: No evidence of dissection, stenosis (50% or greater) or occlusion, status post LEFT carotid endarterectomy. Vertebral arteries: 50% ostial stenosis on the RIGHT. No ostial stenosis on the LEFT. Vertebrals are patent throughout the neck, with a RIGHT mildly dominant. Skeleton: No worrisome osseous lesion.  C6-C7 fusion. Other neck: No masses. Upper chest: Azygos lobe. Scarring and emphysema. Fluid and calcification in the azygos fissure. Review of the MIP images confirms the above findings CTA HEAD FINDINGS Anterior circulation: Calcification of the cavernous internal carotid arteries consistent with cerebrovascular atherosclerotic disease. Estimated BILATERAL calcific siphon stenoses are 50-75%, stable from priors. No signs of intracranial large vessel occlusion. Posterior circulation: RIGHT vertebral dominant. LEFT vertebral supplies PICA, with rudimentary basilar connection. There is at estimated 50% stenosis of the proximal basilar. Distal basilar appears normal caliber. No cerebellar branch occlusion. Venous sinuses: As permitted by contrast timing, patent. Anatomic variants: None of significance. Delayed phase: No abnormal postcontrast enhancement. Review of the MIP images confirms the above findings IMPRESSION: Status post LEFT carotid endarterectomy, widely  patent. IntraStent stenosis, proximal RIGHT ICA, 50%. 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 Non dominant LEFT vertebral, arising from the arch, widely patent. 50% stenosis  dominant RIGHT vertebral, stable from priors. 50% stenosis proximal basilar, stable from priors. 50-75% calcific stenosis both carotid siphons, stable from priors. Electronically Signed   By: Staci Righter M.D.   On: 04/13/2018 13:20    ASSESSMENT AND PLAN:  Active Problems:   Pre-syncope   Mr. Prindle is a 78 year old male with a history of coronary artery disease s/p PCI with stent to mid LAD 03/18, peripheral vascular disease s/p left SFA stenting, and carotid artery stenosis s/p endarterectomy on left side with TCAR on right, COPD, who presented after having two near syncopal episodes at work. 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 He will need a dual chamber pacemaker. Patient has remained in sinus rhythm during admission. He has been asymptomatic throughout his stay. Safe for discharge home with outpatient pacemaker placement next week.   1. Outpatient pacemaker placement scheduled for Monday 04/20/2018 with Dr. Saralyn Pilar. 2. Last received dose of plavix on 04/14/2018. Will need to hold plavix for 5 days prior surgery.  3. No further cardiac diagnostics or interventions required at this time. Patient appropriate for discharge from a cardiac perspective.    Hilbert Odor, PA-C 04/15/2018 10:29 AM

## 2018-04-16 LAB — BASIC METABOLIC PANEL
Anion gap: 7 (ref 5–15)
BUN: 15 mg/dL (ref 8–23)
CO2: 21 mmol/L — ABNORMAL LOW (ref 22–32)
Calcium: 8.5 mg/dL — ABNORMAL LOW (ref 8.9–10.3)
Chloride: 111 mmol/L (ref 98–111)
Creatinine, Ser: 0.94 mg/dL (ref 0.61–1.24)
GFR calc Af Amer: >60 mL/min (ref >60)
GFR calc non Af Amer: >60 mL/min (ref >60)
Glucose, Bld: 106 mg/dL — ABNORMAL HIGH (ref 70–99)
POTASSIUM: 3.4 mmol/L — AB (ref 3.5–5.1)
Sodium: 139 mmol/L (ref 135–145)

## 2018-04-16 LAB — MAGNESIUM: Magnesium: 1.8 mg/dL (ref 1.7–2.4)

## 2018-04-16 MED ORDER — GUAIFENESIN 100 MG/5ML PO SOLN: 5.0000 mL | Freq: Four times a day (QID) | ORAL | 0 refills | Status: AC | PRN

## 2018-04-16 MED ORDER — POTASSIUM CHLORIDE CRYS ER 20 MEQ PO TBCR
40.0000 meq | EXTENDED_RELEASE_TABLET | Freq: Once | ORAL | Status: DC
  Administered 2018-04-16: 40 meq via ORAL

## 2018-04-16 MED ORDER — POTASSIUM CHLORIDE CRYS ER 20 MEQ PO TBCR
40.0000 meq | EXTENDED_RELEASE_TABLET | Freq: Once | ORAL | Status: AC
  Administered 2018-04-16: 40 meq via ORAL
  Filled 2018-04-16: qty 2

## 2018-04-16 MED ORDER — AZITHROMYCIN 500 MG PO TABS: 500.0000 mg | ORAL_TABLET | Freq: Every day | ORAL | 0 refills | Status: AC

## 2018-04-16 NOTE — Discharge Summary (Signed)
[facility] NAME: Timothy Mcmahon    MR#:  [id number]  DATE OF BIRTH:  1940/04/27  DATE OF ADMISSION:  04/13/2018   ADMITTING PHYSICIAN: Henreitta Leber, MD  DATE OF DISCHARGE: 04/16/2018  PRIMARY CARE PHYSICIAN: Coy Saunas, MD   ADMISSION DIAGNOSIS:  Postural dizziness with presyncope [R42, R55] Nodule of upper lobe of left lung [R91.1] DISCHARGE DIAGNOSIS:  Active Problems:   Pre-syncope  SECONDARY DIAGNOSIS:   Past Medical History:  Diagnosis Date   CAD (coronary artery disease)    COPD (chronic obstructive pulmonary disease) (HCC)    Hyperlipidemia    Peripheral vascular disease ([place])    HOSPITAL COURSE:  Chief complaint; near syncope   History of presenting complaint; Timothy Mcmahon  is a 78 y.o. male with a known history of coronary artery disease, COPD, hypertension, hyperlipidemia, peripheral vascular disease who presented to the hospital due to recurrent presyncopal episodes.  Patient says he has had these symptoms before and recent saw his cardiologist at [facility] and had a Linq device placed in his chest.  This was just done about a month or so ago, but on the day of admission while at work he had 2 episodes where he felt dizzy and had to sit down but never truly passed out.  Patient was admitted to medical service for further evaluation.  Hospital course; 1. 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 He will need a dual chamber pacemaker. Patient has remained in sinus rhythm during admission. He has been asymptomatic throughout his stay. Safe for discharge home with outpatient pacemaker placement .Patient scheduled for pacemaker placement for Monday 04/20/2018 with Dr. Saralyn Pilar.  Last received dose of plavix on  04/14/2018. Will need to hold plavix for 5 days prior surgery.  No further cardiac diagnostics or interventions required at this time CT of the head neck shows no acute pathology. Patient has a previous history of carotid artery stenosis with stable left-sided carotid endarterectomy which is widely patent.-Right ICA has 50% stenosis. He has no neurologic symptoms.  Patient follows up with vascular surgery service at [facility].  Most of the findings on CAT scan are chronic and not new for which he follows up with vascular surgeon at [facility] as confirmed by daughter; Timothy Mcmahon. Patient has a Linq/Loop monitor on his chest which was implanted about a month ago at [facility].  Patient seen by cardiologist.  No evidence of acute events on telemetry so far.  Troponin negative x3.   Patient appears to have had evidence of orthostatic hypotension on presentation.  Patient was hydrated with IV fluids   2. Hypokalemia Replaced prior to discharge  3. Acute kidney injury- Resolved with IV fluid hydration.    Resumed ACE inhibitor on discharge   4. Essential hypertension-continue metoprolol and lisinopril.  Outpatient monitoring by primary care physician  5. BPH-continue Flomax. No urine retention.  6. GERD-continue Protonix.  7. History of peripheral vascular disease-continue Plavix, atorvastatin.  8. COPD-no acute exacerbation. Continue Dulera, Spiriva and albuterol nebulizers as needed.  9.  Fevers with cough  Patient had documented fever about 2 days ago.  Has remained afebrile since then. Patient's chest x-ray with no acute infiltrate apart from 8 mm left upper lobe nodule.  Recommendation was for follow-up CT chest in 3 months to ensure stability. Influenza test negative.  Respiratory virus panel negative. Concern for possible covid virus infection.  Test has been requested and patient placed on droplet and contact isolation.  Patient was treated empirically with IV antibiotics with azithromycin and  Rocephin for probable community-acquired pneumonia.  Has remained afebrile.  So far no growth on cultures.  Patient being discharged empirically on p.o. azithromycin. I discussed with patient and patient's daughter Ms. Timothy Mcmahon and family has decided on having patient discharged home to do self isolation at home until results of covid test is available.  Patient strongly advised to isolate himself until he he will department calls him with results of the covid testing.    DISCHARGE CONDITIONS:  Stable CONSULTS OBTAINED:  Treatment Team:  Corey Skains, MD DRUG ALLERGIES:   Allergies  Allergen Reactions   Aspirin Nausea And Vomiting    With high dose ASA only   DISCHARGE MEDICATIONS:   Allergies as of 04/16/2018      Reactions   Aspirin Nausea And Vomiting   With high dose ASA only      Medication List    STOP taking these medications   clopidogrel 75 MG tablet Commonly known as:  PLAVIX   ibuprofen 200 MG tablet Commonly known as:  ADVIL,MOTRIN     TAKE these medications   albuterol 108 (90 Base) MCG/ACT inhaler Commonly known as:  PROVENTIL HFA;VENTOLIN HFA Inhale 1-2 puffs into the lungs every 6 (six) hours as needed for wheezing.   albuterol (2.5 MG/3ML) 0.083% nebulizer solution Commonly known as:  PROVENTIL Take 3 mLs by nebulization 2 (two) times daily.   atorvastatin 80 MG tablet Commonly known as:  LIPITOR Take 80 mg by mouth daily.   azithromycin 500 MG tablet Commonly known as:  Zithromax Take 1 tablet (500 mg total) by mouth daily for 3 days. Take 1 tablet daily for 3 days.   guaiFENesin 100 MG/5ML Soln Commonly known as:  ROBITUSSIN Take 5 mLs (100 mg total) by mouth every 6 (six) hours as needed for up to 5 days for cough or to loosen phlegm.   lisinopril 5 MG tablet Commonly known as:  PRINIVIL,ZESTRIL Take 5 mg by mouth daily.   metoprolol tartrate 25 MG tablet Commonly known as:  LOPRESSOR Take 25 mg by mouth 2 (two) times daily.     omeprazole 20 MG capsule Commonly known as:  PRILOSEC Take 20 mg by mouth daily.   Sodium Chloride (Inhalant) 7 % Nebu Take 4 mLs by nebulization 2 (two) times daily.   Spiriva Respimat 2.5 MCG/ACT Aers Generic drug:  Tiotropium Bromide Monohydrate Inhale 2 puffs into the lungs daily.   Symbicort 160-4.5 MCG/ACT inhaler Generic drug:  budesonide-formoterol Inhale 2 puffs into the lungs 2 (two) times daily.   tamsulosin 0.4 MG Caps capsule Commonly known as:  FLOMAX Take 0.4 mg by mouth daily.   traZODone 100 MG tablet Commonly known as:  DESYREL Take 100 mg by mouth at bedtime.        DISCHARGE INSTRUCTIONS:   DIET:  Cardiac diet DISCHARGE CONDITION:  Stable ACTIVITY:  Activity as tolerated OXYGEN:  Home Oxygen: No.  Oxygen Delivery: room air DISCHARGE LOCATION:  home   If you experience worsening of your admission symptoms, develop shortness of breath, life threatening emergency, suicidal or homicidal thoughts you must seek medical attention immediately by calling 911 or calling your MD immediately  if symptoms less severe.  You Must read complete instructions/literature along with all the possible adverse reactions/side effects for all the  Medicines you take and that have been prescribed to you. Take any new Medicines after you have completely understood and accpet all the possible adverse reactions/side effects.   Please note  You were cared for by a hospitalist during your hospital stay. If you have any questions about your discharge medications or the care you received while you were in the hospital after you are discharged, you can call the unit and asked to speak with the hospitalist on call if the hospitalist that took care of you is not available. Once you are discharged, your primary care physician will handle any further medical issues. Please note that NO REFILLS for any discharge medications will be authorized once you are discharged, as it is imperative  that you return to your primary care physician (or establish a relationship with a primary care physician if you do not have one) for your aftercare needs so that they can reassess your need for medications and monitor your lab values.    On the day of Discharge:  VITAL SIGNS:  Blood pressure 122/70, pulse 77, temperature 98 F (36.7 C), resp. rate 20, height 5\' 11"  (1.803 m), weight 69.2 kg, SpO2 97 %. PHYSICAL EXAMINATION:  GENERAL:  78 y.o.-year-old patient lying in the bed with no acute distress.  EYES: Pupils equal, round, reactive to light and accommodation. No scleral icterus. Extraocular muscles intact.  HEENT: Head atraumatic, normocephalic. Oropharynx and nasopharynx clear.  NECK:  Supple, no jugular venous distention. No thyroid enlargement, no tenderness.  LUNGS: Normal breath sounds bilaterally, no wheezing, rales,rhonchi or crepitation. No use of accessory muscles of respiration.  CARDIOVASCULAR: S1, S2 normal. No murmurs, rubs, or gallops.  ABDOMEN: Soft, non-tender, non-distended. Bowel sounds present. No organomegaly or mass.  EXTREMITIES: No pedal edema, cyanosis, or clubbing.  NEUROLOGIC: Cranial nerves II through XII are intact. Muscle strength 5/5 in all extremities. Sensation intact. Gait not checked.  PSYCHIATRIC: The patient is alert and oriented x 3.  SKIN: No obvious rash, lesion, or ulcer.  DATA REVIEW:   CBC Recent Labs  Lab 04/15/18 0639  WBC 6.1  HGB 11.4*  HCT 34.2*  PLT 135*    Chemistries  Recent Labs  Lab 04/13/18 1105  04/16/18 0224  NA 138   < > 139  K 3.0*   < > 3.4*  CL 107   < > 111  CO2 21*   < > 21*  GLUCOSE 105*   < > 106*  BUN 23   < > 15  CREATININE 1.40*   < > 0.94  CALCIUM 8.5*   < > 8.5*  MG  --    < > 1.8  AST 18  --   --   ALT 16  --   --   ALKPHOS 92  --   --   BILITOT 0.6  --   --    < > = values in this interval not displayed.     Microbiology Results  Results for orders placed or performed during the hospital  encounter of 04/13/18  CULTURE, BLOOD (ROUTINE X 2) w Reflex to ID Panel     Status: None (Preliminary result)   Collection Time: 04/14/18 10:02 AM  Result Value Ref Range Status   Specimen Description BLOOD RIGHT [facility]  Final   Special Requests   Final    BOTTLES DRAWN AEROBIC AND ANAEROBIC Blood Culture results may not be optimal due to an excessive volume of blood received in culture bottles   Culture  Final    NO GROWTH 2 DAYS Performed at [facility], [address] [place], [place] [address]    Report Status PENDING  Incomplete  CULTURE, BLOOD (ROUTINE X 2) w Reflex to ID Panel     Status: None (Preliminary result)   Collection Time: 04/14/18 10:03 AM  Result Value Ref Range Status   Specimen Description BLOOD RIGHT HAND  Final   Special Requests   Final    BOTTLES DRAWN AEROBIC AND ANAEROBIC Blood Culture adequate volume   Culture   Final    NO GROWTH 2 DAYS Performed at [facility], [address] [place], [place] [address]    Report Status PENDING  Incomplete  Respiratory Panel by PCR     Status: None   Collection Time: 04/14/18 10:24 AM  Result Value Ref Range Status   Adenovirus NOT DETECTED NOT DETECTED Final   Coronavirus 229E NOT DETECTED NOT DETECTED Final    Comment: (NOTE) The Coronavirus on the Respiratory Panel, DOES NOT test for the novel  Coronavirus (2019 nCoV)    Coronavirus HKU1 NOT DETECTED NOT DETECTED Final   Coronavirus NL63 NOT DETECTED NOT DETECTED Final   Coronavirus OC43 NOT DETECTED NOT DETECTED Final   Metapneumovirus NOT DETECTED NOT DETECTED Final   Rhinovirus / Enterovirus NOT DETECTED NOT DETECTED Final   Influenza A NOT DETECTED NOT DETECTED Final   Influenza B NOT DETECTED NOT DETECTED Final   Parainfluenza Virus 1 NOT DETECTED NOT DETECTED Final   Parainfluenza Virus 2 NOT DETECTED NOT DETECTED Final   Parainfluenza Virus 3 NOT DETECTED NOT DETECTED Final   Parainfluenza Virus 4 NOT DETECTED NOT DETECTED Final    Respiratory Syncytial Virus NOT DETECTED NOT DETECTED Final   Bordetella pertussis NOT DETECTED NOT DETECTED Final   Chlamydophila pneumoniae NOT DETECTED NOT DETECTED Final   Mycoplasma pneumoniae NOT DETECTED NOT DETECTED Final    Comment: Performed at [facility], [place]. [address] [place], [place] [address]  MRSA PCR Screening     Status: None   Collection Time: 04/14/18  9:47 PM  Result Value Ref Range Status   MRSA by PCR NEGATIVE NEGATIVE Final    Comment:        The GeneXpert MRSA Assay (FDA approved for NASAL specimens only), is one component of a comprehensive MRSA colonization surveillance program. It is not intended to diagnose MRSA infection nor to guide or monitor treatment for MRSA infections. Performed at [facility], [address], [place], [place] [address]     RADIOLOGY:  No results found.   Management plans discussed with the patient, family and they are in agreement.  CODE STATUS: Full Code   TOTAL TIME TAKING CARE OF THIS PATIENT: 37 minutes.    Kendi Defalco M.D on 04/16/2018 at 10:29 AM  Between 7am to 6pm - Pager - [phone]  After 6pm go to [email or web address] - Proofreader  [facility]  Office  [phone]  CC: Primary care physician; Coy Saunas, MD   Note: This dictation was prepared with Dragon dictation along with smaller phrase technology. Any transcriptional errors that result from this process are unintentional.

## 2018-04-16 NOTE — Progress Notes (Signed)
Ch spoke w/ Timothy Mcmahon over the phone to determine how well he was progressing. Timothy Mcmahon shared that he was going to be d/c today. Timothy Mcmahon also stated that he will be returning on Monday for a PACE maker. Ch shared that she would possibly be there to see him in pre-op. 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 Timothy Mcmahon appreciated the call. No further needs at this time.    04/16/18 1000  Clinical Encounter Type  Visited With Patient  Visit Type Psychological support;Spiritual support;Social support  Spiritual Encounters  Spiritual Needs Emotional;Grief support  Stress Factors  Patient Stress Factors Health changes;Major life changes  Family Stress Factors None identified

## 2018-04-16 NOTE — Progress Notes (Signed)
Pt to be discharged today. Iv and tele removed. disch instructions and covid self isolation instructions signed by pt and copy placed in chart. Awaiting transport by family.

## 2018-04-19 LAB — CULTURE, BLOOD (ROUTINE X 2)
Culture: NO GROWTH
Culture: NO GROWTH
Special Requests: ADEQUATE

## 2018-04-23 LAB — NOVEL CORONAVIRUS, NAA (HOSP ORDER, SEND-OUT TO REF LAB; TAT 18-24 HRS): SARS-CoV-2, NAA: NOT DETECTED

## 2018-04-29 MED ORDER — CEFAZOLIN SODIUM-DEXTROSE 1-4 GM/50ML-% IV SOLN
1.0000 g | Freq: Once | INTRAVENOUS | Status: AC
  Administered 2018-04-30: 1 g via INTRAVENOUS

## 2018-04-30 ENCOUNTER — Ambulatory Visit: Payer: Medicare Other | Admitting: Anesthesiology

## 2018-04-30 ENCOUNTER — Observation Stay: Payer: Medicare Other

## 2018-04-30 ENCOUNTER — Observation Stay
Admission: RE | Admit: 2018-04-30 | Discharge: 2018-05-01 | Disposition: A | Discharge status: Home / Self Care | Payer: Medicare Other | Attending: Cardiology | Admitting: Cardiology

## 2018-04-30 ENCOUNTER — Ambulatory Visit: Payer: Medicare Other

## 2018-04-30 ENCOUNTER — Encounter
Admission: RE | Disposition: A | Discharge status: Home / Self Care | Payer: Self-pay | Source: Home / Self Care | Attending: Cardiology

## 2018-04-30 ENCOUNTER — Encounter: Payer: Self-pay | Admitting: *Deleted

## 2018-04-30 ENCOUNTER — Encounter: Admission: RE | Payer: Self-pay | Source: Home / Self Care

## 2018-04-30 ENCOUNTER — Inpatient Hospital Stay: Admission: RE | Admit: 2018-04-30 | Payer: Medicare Other | Source: Home / Self Care | Admitting: Cardiology

## 2018-04-30 ENCOUNTER — Other Ambulatory Visit: Payer: Self-pay

## 2018-04-30 DIAGNOSIS — R55 Syncope and collapse: Secondary | ICD-10-CM | POA: Diagnosis not present

## 2018-04-30 DIAGNOSIS — J449 Chronic obstructive pulmonary disease, unspecified: Secondary | ICD-10-CM | POA: Diagnosis not present

## 2018-04-30 DIAGNOSIS — Z955 Presence of coronary angioplasty implant and graft: Secondary | ICD-10-CM | POA: Insufficient documentation

## 2018-04-30 DIAGNOSIS — I495 Sick sinus syndrome: Secondary | ICD-10-CM | POA: Diagnosis present

## 2018-04-30 DIAGNOSIS — E782 Mixed hyperlipidemia: Secondary | ICD-10-CM | POA: Insufficient documentation

## 2018-04-30 DIAGNOSIS — Z79899 Other long term (current) drug therapy: Secondary | ICD-10-CM | POA: Insufficient documentation

## 2018-04-30 DIAGNOSIS — I739 Peripheral vascular disease, unspecified: Secondary | ICD-10-CM | POA: Insufficient documentation

## 2018-04-30 DIAGNOSIS — Z950 Presence of cardiac pacemaker: Secondary | ICD-10-CM

## 2018-04-30 DIAGNOSIS — I10 Essential (primary) hypertension: Secondary | ICD-10-CM | POA: Insufficient documentation

## 2018-04-30 DIAGNOSIS — G4730 Sleep apnea, unspecified: Secondary | ICD-10-CM | POA: Diagnosis not present

## 2018-04-30 DIAGNOSIS — F17210 Nicotine dependence, cigarettes, uncomplicated: Secondary | ICD-10-CM | POA: Insufficient documentation

## 2018-04-30 HISTORY — PX: PACEMAKER INSERTION: SHX728

## 2018-04-30 LAB — PROTIME-INR
INR: 1.1 (ref 0.8–1.2)
Prothrombin Time: 13.7 seconds (ref 11.4–15.2)

## 2018-04-30 LAB — APTT: aPTT: 35 seconds (ref 24–36)

## 2018-04-30 SURGERY — INSERTION, CARDIAC PACEMAKER
Anesthesia: Choice

## 2018-04-30 SURGERY — INSERTION, CARDIAC PACEMAKER
Anesthesia: General

## 2018-04-30 MED ORDER — LIDOCAINE HCL (PF) 1 % IJ SOLN
INTRAMUSCULAR | Status: DC | PRN
  Administered 2018-04-30: 35 mL

## 2018-04-30 MED ORDER — SODIUM CHLORIDE 0.9 % IV SOLN
INTRAVENOUS | Status: DC | PRN
  Administered 2018-04-30: 14:00:00

## 2018-04-30 MED ORDER — FAMOTIDINE 20 MG PO TABS
20.0000 mg | ORAL_TABLET | Freq: Once | ORAL | Status: AC
  Administered 2018-04-30: 13:00:00 20 mg via ORAL

## 2018-04-30 MED ORDER — FENTANYL CITRATE (PF) 100 MCG/2ML IJ SOLN
25.0000 ug | INTRAMUSCULAR | Status: AC | PRN
  Administered 2018-04-30 (×3): 25 ug via INTRAVENOUS
  Administered 2018-04-30: 100 ug via INTRAVENOUS
  Administered 2018-04-30 (×2): 25 ug via INTRAVENOUS

## 2018-04-30 MED ORDER — PROPOFOL 10 MG/ML IV BOLUS
INTRAVENOUS | Status: DC | PRN
  Administered 2018-04-30: 30 mg via INTRAVENOUS
  Administered 2018-04-30: 40 mg via INTRAVENOUS

## 2018-04-30 MED ORDER — GENTAMICIN SULFATE 40 MG/ML IJ SOLN
INTRAMUSCULAR | Status: AC
  Filled 2018-04-30: qty 2

## 2018-04-30 MED ORDER — ONDANSETRON HCL 4 MG/2ML IJ SOLN: 4.0000 mg | Freq: Once | INTRAMUSCULAR | Status: DC | PRN

## 2018-04-30 MED ORDER — ALBUTEROL SULFATE (2.5 MG/3ML) 0.083% IN NEBU: 2.5000 mg | INHALATION_SOLUTION | Freq: Four times a day (QID) | RESPIRATORY_TRACT | Status: DC | PRN

## 2018-04-30 MED ORDER — ONDANSETRON HCL 4 MG/2ML IJ SOLN: 4.0000 mg | Freq: Four times a day (QID) | INTRAMUSCULAR | Status: DC | PRN

## 2018-04-30 MED ORDER — LIDOCAINE HCL (PF) 1 % IJ SOLN
INTRAMUSCULAR | Status: AC
  Filled 2018-04-30: qty 30

## 2018-04-30 MED ORDER — FENTANYL CITRATE (PF) 100 MCG/2ML IJ SOLN
INTRAMUSCULAR | Status: AC
  Filled 2018-04-30: qty 2

## 2018-04-30 MED ORDER — FAMOTIDINE 20 MG PO TABS
ORAL_TABLET | ORAL | Status: AC
  Administered 2018-04-30: 13:00:00 20 mg via ORAL
  Filled 2018-04-30: qty 1

## 2018-04-30 MED ORDER — TIOTROPIUM BROMIDE MONOHYDRATE 18 MCG IN CAPS
18.0000 ug | ORAL_CAPSULE | Freq: Every day | RESPIRATORY_TRACT | Status: DC
  Administered 2018-05-01: 09:00:00 18 ug via RESPIRATORY_TRACT
  Filled 2018-04-30: qty 5

## 2018-04-30 MED ORDER — ACETAMINOPHEN 325 MG PO TABS
325.0000 mg | ORAL_TABLET | ORAL | Status: DC | PRN
  Administered 2018-04-30 – 2018-05-01 (×2): 650 mg via ORAL
  Filled 2018-04-30 (×2): qty 2

## 2018-04-30 MED ORDER — CEFAZOLIN SODIUM-DEXTROSE 1-4 GM/50ML-% IV SOLN
INTRAVENOUS | Status: AC
  Filled 2018-04-30: qty 50

## 2018-04-30 MED ORDER — MIDAZOLAM HCL 2 MG/2ML IJ SOLN
INTRAMUSCULAR | Status: DC | PRN
  Administered 2018-04-30: .5 mg via INTRAVENOUS

## 2018-04-30 MED ORDER — MIDAZOLAM HCL 2 MG/2ML IJ SOLN
INTRAMUSCULAR | Status: AC
  Filled 2018-04-30: qty 2

## 2018-04-30 MED ORDER — LACTATED RINGERS IV SOLN
INTRAVENOUS | Status: DC
  Administered 2018-04-30 (×2): via INTRAVENOUS

## 2018-04-30 MED ORDER — FENTANYL CITRATE (PF) 100 MCG/2ML IJ SOLN
INTRAMUSCULAR | Status: DC | PRN
  Administered 2018-04-30: 25 ug via INTRAVENOUS

## 2018-04-30 MED ORDER — TRAZODONE HCL 50 MG PO TABS
50.0000 mg | ORAL_TABLET | Freq: Every day | ORAL | Status: DC
  Administered 2018-04-30: 21:00:00 50 mg via ORAL
  Filled 2018-04-30: qty 1

## 2018-04-30 MED ORDER — LACTATED RINGERS IV SOLN
INTRAVENOUS | Status: DC
  Administered 2018-04-30 (×3): via INTRAVENOUS

## 2018-04-30 MED ORDER — SODIUM CHLORIDE FLUSH 0.9 % IV SOLN
INTRAVENOUS | Status: AC
  Filled 2018-04-30: qty 3

## 2018-04-30 MED ORDER — AZELASTINE HCL 0.1 % NA SOLN
2.0000 | Freq: Two times a day (BID) | NASAL | Status: DC
  Administered 2018-05-01: 2 via NASAL
  Filled 2018-04-30: qty 30

## 2018-04-30 MED ORDER — SODIUM CHLORIDE 0.9 % IV SOLN
Freq: Once | INTRAVENOUS | Status: DC
  Filled 2018-04-30: qty 2

## 2018-04-30 MED ORDER — TRAMADOL HCL 50 MG PO TABS
50.0000 mg | ORAL_TABLET | Freq: Once | ORAL | Status: AC
  Administered 2018-04-30: 50 mg via ORAL
  Filled 2018-04-30: qty 1

## 2018-04-30 MED ORDER — FLUTICASONE FUROATE-VILANTEROL 100-25 MCG/INH IN AEPB
1.0000 | INHALATION_SPRAY | Freq: Every day | RESPIRATORY_TRACT | Status: DC
  Administered 2018-05-01: 1 via RESPIRATORY_TRACT
  Filled 2018-04-30: qty 28

## 2018-04-30 MED ORDER — PROPOFOL 500 MG/50ML IV EMUL
INTRAVENOUS | Status: DC | PRN
  Administered 2018-04-30: 150 ug/kg/min via INTRAVENOUS

## 2018-04-30 MED ORDER — FLUTICASONE PROPIONATE 50 MCG/ACT NA SUSP
2.0000 | Freq: Every day | NASAL | Status: DC
  Administered 2018-05-01: 2 via NASAL
  Filled 2018-04-30: qty 16

## 2018-04-30 MED ORDER — CEFAZOLIN SODIUM-DEXTROSE 1-4 GM/50ML-% IV SOLN
1.0000 g | Freq: Four times a day (QID) | INTRAVENOUS | Status: AC
  Administered 2018-04-30 – 2018-05-01 (×3): 1 g via INTRAVENOUS
  Filled 2018-04-30 (×3): qty 50

## 2018-04-30 MED ORDER — LIDOCAINE HCL (CARDIAC) PF 100 MG/5ML IV SOSY
PREFILLED_SYRINGE | INTRAVENOUS | Status: DC | PRN
  Administered 2018-04-30: 40 mg via INTRAVENOUS

## 2018-04-30 MED ORDER — FENTANYL CITRATE (PF) 100 MCG/2ML IJ SOLN
INTRAMUSCULAR | Status: AC
  Administered 2018-04-30: 16:00:00 25 ug via INTRAVENOUS
  Filled 2018-04-30: qty 2

## 2018-04-30 MED ORDER — EPHEDRINE SULFATE 50 MG/ML IJ SOLN
INTRAMUSCULAR | Status: DC | PRN
  Administered 2018-04-30: 10 mg via INTRAVENOUS

## 2018-04-30 MED ORDER — ATORVASTATIN CALCIUM 80 MG PO TABS
80.0000 mg | ORAL_TABLET | Freq: Every day | ORAL | Status: DC
  Administered 2018-04-30: 80 mg via ORAL
  Filled 2018-04-30 (×2): qty 1

## 2018-04-30 MED ORDER — VARENICLINE TARTRATE 1 MG PO TABS
1.0000 mg | ORAL_TABLET | Freq: Two times a day (BID) | ORAL | Status: DC
  Administered 2018-05-01: 1 mg via ORAL
  Filled 2018-04-30 (×3): qty 1

## 2018-04-30 MED ORDER — PROPOFOL 500 MG/50ML IV EMUL
INTRAVENOUS | Status: AC
  Filled 2018-04-30: qty 50

## 2018-04-30 MED ORDER — FENTANYL CITRATE (PF) 100 MCG/2ML IJ SOLN
INTRAMUSCULAR | Status: AC
  Administered 2018-04-30: 16:00:00 100 ug via INTRAVENOUS
  Filled 2018-04-30: qty 2

## 2018-04-30 MED ORDER — LISINOPRIL 5 MG PO TABS
5.0000 mg | ORAL_TABLET | Freq: Every day | ORAL | Status: DC
  Administered 2018-05-01: 09:00:00 5 mg via ORAL
  Filled 2018-04-30 (×2): qty 1

## 2018-04-30 SURGICAL SUPPLY — 39 items
BAG DECANTER FOR FLEXI CONT (MISCELLANEOUS) ×3 IMPLANT
BRUSH SCRUB EZ  4% CHG (MISCELLANEOUS) ×2
BRUSH SCRUB EZ 4% CHG (MISCELLANEOUS) ×1 IMPLANT
CABLE SURG 12 DISP A/V CHANNEL (MISCELLANEOUS) ×3 IMPLANT
CANISTER SUCT 1200ML W/VALVE (MISCELLANEOUS) ×3 IMPLANT
CHLORAPREP W/TINT 26 (MISCELLANEOUS) ×3 IMPLANT
COVER LIGHT HANDLE STERIS (MISCELLANEOUS) ×6 IMPLANT
COVER MAYO STAND STRL (DRAPES) ×3 IMPLANT
COVER WAND RF STERILE (DRAPES) ×3 IMPLANT
DRAPE C-ARM XRAY 36X54 (DRAPES) ×3 IMPLANT
DRSG TEGADERM 4X4.75 (GAUZE/BANDAGES/DRESSINGS) ×3 IMPLANT
DRSG TELFA 4X3 1S NADH ST (GAUZE/BANDAGES/DRESSINGS) ×3 IMPLANT
ELECT REM PT RETURN 9FT ADLT (ELECTROSURGICAL) ×3
ELECTRODE REM PT RTRN 9FT ADLT (ELECTROSURGICAL) ×1 IMPLANT
GLIDEWIRE STIFF .35X180X3 HYDR (WIRE) IMPLANT
GLOVE BIO SURGEON STRL SZ7.5 (GLOVE) ×3 IMPLANT
GLOVE BIO SURGEON STRL SZ8 (GLOVE) ×3 IMPLANT
GOWN STRL REUS W/ TWL LRG LVL3 (GOWN DISPOSABLE) ×1 IMPLANT
GOWN STRL REUS W/ TWL XL LVL3 (GOWN DISPOSABLE) ×1 IMPLANT
GOWN STRL REUS W/TWL LRG LVL3 (GOWN DISPOSABLE) ×2
GOWN STRL REUS W/TWL XL LVL3 (GOWN DISPOSABLE) ×2
IMMOBILIZER SHDR MD LX WHT (SOFTGOODS) IMPLANT
IMMOBILIZER SHDR XL LX WHT (SOFTGOODS) IMPLANT
INTRO PACEMAKR LEAD 9FR 13CM (INTRODUCER) ×3
INTRO PACEMKR SHEATH II 7FR (MISCELLANEOUS) ×3
INTRODUCER PACEMKR LD 9FR 13CM (INTRODUCER) ×1 IMPLANT
INTRODUCER PACEMKR SHTH II 7FR (MISCELLANEOUS) ×1 IMPLANT
IPG PACE AZUR XT DR MRI W1DR01 (Pacemaker) IMPLANT
IV NS 500ML (IV SOLUTION) ×2
IV NS 500ML BAXH (IV SOLUTION) ×1 IMPLANT
KIT TURNOVER KIT A (KITS) ×3 IMPLANT
LABEL OR SOLS (LABEL) ×3 IMPLANT
LEAD CAPSURE NOVUS 5076-52CM (Lead) ×2 IMPLANT
LEAD CAPSURE NOVUS 5076-58CM (Lead) ×2 IMPLANT
MARKER SKIN DUAL TIP RULER LAB (MISCELLANEOUS) ×3 IMPLANT
PACE AZURE XT DR MRI W1DR01 (Pacemaker) ×3 IMPLANT
PACK PACE INSERTION (MISCELLANEOUS) ×3 IMPLANT
PAD ONESTEP ZOLL R SERIES ADT (MISCELLANEOUS) ×3 IMPLANT
SUT SILK 0 SH 30 (SUTURE) ×9 IMPLANT

## 2018-04-30 NOTE — H&P (Signed)
Jump to Section ? Discontinued MedicationsDocument InformationEncounter DetailsHistorical MedicationsLast Filed Vital SignsMiscellaneous ResultsPatient ContactsPatient DemographicsPlan of TreatmentProceduresProgress NotesReason for VisitSocial HistoryVisit Diagnoses [name] Encounter Summary, generated on Apr. 09, 2020April 09, 2020 Printout Information  Document Contents Document Received Date Document Source Organization  Initial consult Apr. 09, 2020April 09, 2020 [place]   Patient Demographics - 78 y.o. Male; born [birth year] [place] 01, 1942  Patient Address Communication Language Race / Ethnicity Marital Status  [address] [place], [place] [address] [phone] (Home) English (Preferred) White / Not Hispanic or Latino Married   Reason for Visit  Reason Comments  heart block needs pacemaker  Hyperlipidemia   Hypertension   Peripheral Vascular Disease   Syncope    Encounter Details  Date Type Department Care Team Description  04/27/2018 Initial consult [facility]  [place], [place] [address]  [phone]  Flossie Dibble, [place]  [facility]  [place], [place] [address]  [phone]  [phone] (Fax)  Cardiac syncope (Primary Dx);  Essential hypertension;  Hyperlipidemia, mixed;  Sinus node dysfunction (CMS-HCC)   Social History - documented as of this encounter Tobacco Use Types Packs/Day Years Used Date  Current Every Day Smoker  1 28 Quit: 08/22/2015  Smokeless Tobacco: Never Used      Alcohol Use Drinks/Week oz/Week Comments  Yes   occasionally   Sex Assigned at [employer] Date Recorded  Not on file    Job Start Date Occupation Industry  Not on file Not on file Not on file   Travel History Travel Start Travel End  No recent travel history available.     Last Filed Vital Signs - documented in this encounter Vital Sign Reading Time  Taken Comments  Blood Pressure 100/60 04/27/2018 10:21 AM EDT   Pulse 60 04/27/2018 10:21 AM EDT   Temperature - -   Respiratory Rate - -   Oxygen Saturation 95% 04/27/2018 10:21 AM EDT   Inhaled Oxygen Concentration - -   Weight 69.4 kg (153 lb) 04/27/2018 10:21 AM EDT   Height 180.3 cm (5\' 11" ) 04/27/2018 10:21 AM EDT   Body Mass Index 21.34 04/27/2018 10:21 AM EDT    Progress Notes - documented in this encounter Flossie Dibble, MD - 04/27/2018 9:45 AM EDT Formatting of this note might be different from the original. Established Patient Visit   Chief Complaint: Chief Complaint  Patient presents with  . heart block  needs pacemaker  . Hyperlipidemia  . Hypertension  . Peripheral Vascular Disease  . Syncope  Date of Service: 04/27/2018 Date of Birth: Nov 24, 1940 PCP: Coy Saunas, MD  History of Present Illness: Timothy Mcmahon is a 78 y.o.male patient  Syncope Patient presents with new onset of a sudden loss of consciousness without warning associated with doing nothing other than walking aroind in his kitchen and blacking out for 5 minutes over the last 2 weeksworsening with increased frequency with bradycardia and sinus node dysfunction by LINQ device and 7 and 4 sec pauses that correlate to his sx Essential Hypertension The patient has been on medication management listed below for essential hypertension. Reported average blood pressure readings recently have shown that the blood pressure is stable. The patient has not had any side effects of these medications at this time. We have discussed treatment goals with the patient for which they understand and agree. This includes medication management, lifestyle changes, and diet management. Currently there is no apparent  secondary causes of the hypertension at this time. Hyperlipidemia The patient does have hyperlipidemia on Moderate intensity therapy with pravastatin (Pravachol). The lipid levels appear to be stable at this  time without apparent significant side effects of medications. There patient currently has knowledge of the continued goals of treatment to reduce cardiovascular risk and/or complication. Reasons for risk reduction and further prevention measures include vascular disease, > 7.5% 10 year cardiovascular risk score and high LDL  Echocardiogram The patient has had an echocardiogram showing Normal LV LVEF > 55% Mild MR Mild TR without pulmonary hypertension 2019 Tobacco use The patient was counseled on the risks of tobacco use and significant hazards. They have been informed of the continued cardiovascular risk of stroke, heart attack, and peripheral vascular disease. We have continued to stress the need for discontinuation and/or abstinence if able.   Past Medical and Surgical History  Past Medical History Past Medical History:  Diagnosis Date  . Claudication (CMS-HCC)  with peripheral vascular disease status post PCI and stent placement of leg arteries.  Marland Kitchen COPD (chronic obstructive pulmonary disease) (CMS-HCC)  . Hyperlipidemia  . Hypertension  . Nicotine abuse  . Sleep apnea   Past Surgical History He has a past surgical history that includes PCI and stent placement.   Medications and Allergies  Current Medications  Current Outpatient Medications on File Prior to Visit  Medication Sig Dispense Refill  . albuterol 90 mcg/actuation inhaler Inhale 2 inhalations into the lungs every 6 (six) hours as needed for Wheezing. 1 Inhaler 12  . azelastine-fluticasone (DYMISTA) 137-50 mcg/spray Spry 137 mcg by Nasal route 2 (two) times daily. 0.137 g 6  . clopidogrel (PLAVIX) 75 mg tablet TAKE 1 TABLET (75 MG TOTAL) BY MOUTH ONCE DAILY. 30 tablet 4  . lisinopril (PRINIVIL,ZESTRIL) 5 MG tablet Take 1 tablet (5 mg total) by mouth once daily. 30 tablet 4  . omeprazole (PRILOSEC) 20 MG DR capsule Take 20 mg by mouth once daily. 3  . SPIRIVA WITH HANDIHALER 18 mcg inhalation capsule PLACE 1 CAPSULE (18 MCG  TOTAL) INTO INHALER AND INHALE ONCE DAILY. 90 capsule 3  . traZODone (DESYREL) 50 MG tablet TAKE 1 TABLET BY MOUTH NIGHTLY 5  . atorvastatin (LIPITOR) 80 MG tablet Take 80 mg by mouth once daily  . CHANTIX 1 mg tablet Take 1 mg by mouth 2 (two) times daily. 1  . fluticasone-vilanterol (BREO ELLIPTA) 100-25 mcg/dose DsDv inhaler Inhale 1 inhalation into the lungs once daily. (Patient not taking: Reported on 04/16/2016 ) 1 Inhaler 6  . predniSONE (DELTASONE) 10 MG tablet Prednisone 10 mg, 4 pills q day x 2 days, 3 pills q day x 2 days, 2 pills q day x 2 days, 1 pill q day x 2 days. (Patient not taking: Reported on 04/27/2018 ) 20 tablet 0   No current facility-administered medications on file prior to visit.   Allergies: Patient has no known allergies.  Social and Family History  Social History reports that he has been smoking. He has a 51.00 pack-year smoking history. He has never used smokeless tobacco. He reports current alcohol use. He reports that he does not use drugs.  Family History Family History  Problem Relation Age of Onset  . Myocardial Infarction (Heart attack) Mother  . Heart failure Mother  . No Known Problems Father  . Multiple sclerosis Sister  . Leukemia Sister  . Hepatitis Child  . Myocardial Infarction (Heart attack) Brother  . Coronary Artery Disease (Blocked arteries around heart) Other  .  Leukemia Other  . Multiple sclerosis Other   Review of Systems   Review of Systems  Positive for syncope sob Negative for weight gain weight loss, weakness, vision change, hearing loss, congestion, PND, orthopnea, heartburn, nausea, diaphoresis, vomiting, diarrhea, bloody stool, melena, stomach pain, extremity pain, leg weakness, leg cramping, leg blood clots, nosebleed, trouble swallowing, mouth pain, urinary frequency, urination at night, muscle weakness, skin lesions, skin rashes, tingling ,ulcers, numbness, anxiety, and/or depression Physical Examination   Vitals:BP 100/60   Pulse 60  Ht 180.3 cm (5\' 11" )  Wt 69.4 kg (153 lb)  SpO2 95%  BMI 21.34 kg/m  Ht:180.3 cm (5\' 11" ) Wt:69.4 kg (153 lb) GXQ:JJHE surface area is 1.86 meters squared. Body mass index is 21.34 kg/m. Appearance: well appearing in no acute distress HEENT: Pupils equally reactive to light and accomodation, no xanthalasma  Neck: Supple, no apparent thyromegaly, masses, or lymphadenopathy  Lungs: normal respiratory effort; no crackles, no rhonchi, no wheezes Heart: Regular rate and rhythm. Normal S1 S2 No gallops, murmur, no rub, PMI is normal size and placement. carotid upstroke normal without bruit. Jugular venous pressure is normal Abdomen: soft, nontender, not distended with normal bowel sounds. No apparent hepatosplenomegally. Abdominal aorta is normal size without bruit Extremities: no edema, no ulcers, no clubbing, no cyanosis Peripheral Pulses: 2+ in upper extremities, 2+ femoral pulses bilaterally, 2+lower extremity  Musculoskeletal; Normal muscle tone without kyphosis Neurological: Oriented and Alert, Cranial nerves intact  Assessment   78 y.o. male with  Encounter Diagnoses  Name Primary?  . Cardiac syncope Yes  . Essential hypertension  . Hyperlipidemia, mixed  . Sinus node dysfunction (CMS-HCC)   Plan  -The patient is to have consultation and permanent pacemaker placement for sinus node dysfunction and syncope. The patient understands all risks and benefits of permanent pacemaker placement. This includes the possibility of death, stroke, heart attack, hemopericardium, pneumothorax, infection, bleeding, blood clot, and reaction to medications. The patient is at low risk for general anesthesia -Continue current medical regimen for hypertension control which is stable at this time and without apparent significant side effects or symptoms of medications. Further treatment goals of low sodium diet for additive effects of these medications have been discussed today as well.  -Continue to use moderate to high intensive cholesterol therapy for further future risk reduction in cardiovascular disease and complication. The patient currently understands the goals, risks, and benefits of lipid treatment. We have discussed the potential side effects profile of these medications and or symptoms. They will watch for any new symptoms. -Patient has had council today on the significance of all forms of tobacco use and the related hazards. We have expressed the need to abstain from the use of tobacco products for prevention of cardiovascular disease, peripheral vascular disease, and of cardiovascular events.  No orders of the defined types were placed in this encounter.  Return in about 4 weeks (around 05/25/2018).  Flossie Dibble, MD    Electronically signed by Flossie Dibble, MD at 04/27/2018 10:40 AM EDT   Plan of Treatment - documented as of this encounter Not on file   Procedures - documented in this encounter Procedure Name Priority Date/Time Associated Diagnosis Comments  ORDER  04/27/2018 12:00 AM EDT  Results for this procedure are in the results section.    Miscellaneous Results - documented in this encounter  ORDER (04/27/2018 12:00 AM EDT) ORDER (04/27/2018 12:00 AM EDT)  Narrative Performed At  This result has an attachment that is not available.  Ordered by an unspecified provider.       Visit Diagnoses - documented in this encounter Diagnosis  Cardiac syncope - Primary  Syncope and collapse   Essential hypertension   Hyperlipidemia, mixed  Mixed hyperlipidemia   Sinus node dysfunction (CMS-HCC)    Discontinued Medications - documented as of this encounter Medication Sig Discontinue Reason Start Date End Date  pravastatin (PRAVACHOL) 20 MG tablet  TAKE 1 TABLET (20 MG TOTAL) BY MOUTH DAILY.  08/13/2015 04/27/2018   Historical Medications - added in this encounter Reconcile with Patient's Chart This list may  reflect changes made after this encounter.  Medication Sig Dispensed Refills Start Date End Date  atorvastatin (LIPITOR) 80 MG tablet  Take 80 mg by mouth once daily  0 02/03/2018   Images  Patient Contacts  Contact Name Contact Address Communication Relationship to Patient  Traeger Sultana [address] [place], [place] [phone] (Home) Spouse, Emergency Contact  Document Information  Primary Care Provider Other Service Providers Document Coverage Dates  Coy Saunas, MD (Feb. 24, 2016February 24, 2016 - Present) DM: [phone] (Work) [phone] (Fax)  Family Medicine  Apr. 06, 2020April 06, 2020   [facility] [address] [place], [place] [address]   Encounter Providers Encounter Date  Flossie Dibble, MD (Attending) DM: [phone] (Work) [phone] (Fax) [facility] [place], [place] [address] Cardiovascular Disease Apr. 06, 2020April 06, 2020    Show All Sections

## 2018-04-30 NOTE — OR Nursing (Signed)
Patient was being ruled out for coronavirus while in the hospital due to fever. Patient has a negative SARS-Cov-2 result tested on 04/14/18. Patient without fever for the past two weeks and no SOB or dry cough. Also Patient denies any loose stools since before he left the hospital I have spoken with Rivka Spring assistant director who also spoke with Sharmon Leyden Director of the OR and they report it is okay for patient to proceed through surgery without isolation needed.

## 2018-04-30 NOTE — Interval H&P Note (Signed)
History and Physical Interval Note:  04/30/2018 1:26 PM  Timothy Mcmahon  has presented today for surgery, with the diagnosis of SSS / BRADYCARDIA SYNCOPE.  The various methods of treatment have been discussed with the patient and family. After consideration of risks, benefits and other options for treatment, the patient has consented to  Procedure(s): INSERTION PACEMAKER DUAL CHAMBER (N/A) as a surgical intervention.  The patient's history has been reviewed, patient examined, no change in status, stable for surgery.  I have reviewed the patient's chart and labs.  Questions were answered to the patient's satisfaction.     Brinley Treanor [employer]

## 2018-04-30 NOTE — Anesthesia Procedure Notes (Signed)
Date/Time: 04/30/2018 2:15 PM Performed by: Allean Found, CRNA Pre-anesthesia Checklist: Emergency Drugs available, Patient identified, Suction available, Patient being monitored and Timeout performed Patient Re-evaluated:Patient Re-evaluated prior to induction Oxygen Delivery Method: Nasal cannula Placement Confirmation: positive ETCO2

## 2018-04-30 NOTE — Progress Notes (Signed)
Notify Dr. Nehemiah Massed about patient's complaints of shoulder pain, given tylenol but was not helping him. Order was given for tramadol once. RN will continue to monitor.

## 2018-04-30 NOTE — Interval H&P Note (Signed)
History and Physical Interval Note:  04/30/2018 1:27 PM  Timothy Mcmahon  has presented today for surgery, with the diagnosis of SSS / BRADYCARDIA SYNCOPE.  The various methods of treatment have been discussed with the patient and family. After consideration of risks, benefits and other options for treatment, the patient has consented to  Procedure(s): INSERTION PACEMAKER DUAL CHAMBER (N/A) as a surgical intervention.  The patient's history has been reviewed, patient examined, no change in status, stable for surgery.  I have reviewed the patient's chart and labs.  Questions were answered to the patient's satisfaction.     Mitsue Peery [employer]

## 2018-04-30 NOTE — Progress Notes (Signed)
Talked to Dr. Erin Fulling about patient's pacemaker site bleeding. Old dressing was saturated with blood and site was bulging, it looks like it has hematoma. Patient complaints of pain and soreness upon palpation, given tramadol for pain. Re-dress the site with gauze and tegaderm. Asked if we need to check H&H, no orders given. RN will continue to monitor.

## 2018-04-30 NOTE — Transfer of Care (Signed)
Immediate Anesthesia Transfer of Care Note  Patient: Timothy Mcmahon  Procedure(s) Performed: INSERTION PACEMAKER DUAL CHAMBER (N/A )  Patient Location: PACU  Anesthesia Type:General  Level of Consciousness: awake and alert   Airway & Oxygen Therapy: Patient Spontanous Breathing and Patient connected to face mask oxygen  Post-op Assessment: Report given to RN and Post -op Vital signs reviewed and stable  Post vital signs: Reviewed and stable  Last Vitals:  Vitals Value Taken Time  BP 155/87 04/30/2018  3:16 PM  Temp    Pulse 78 04/30/2018  3:20 PM  Resp 20 04/30/2018  3:20 PM  SpO2 98 % 04/30/2018  3:20 PM  Vitals shown include unvalidated device data.  Last Pain:  Vitals:   04/30/18 1258  TempSrc: Temporal  PainSc: 0-No pain         Complications: No apparent anesthesia complications

## 2018-04-30 NOTE — H&P (View-Only) (Signed)
Corey Skains, MD  Physician  Cardiology  Consult Note  Addendum  Date of Service:  04/14/2018 8:29 AM          Expand All Collapse All    Show:Clear all [x] Manual[x] Template[x] Copied  Added by: [x] Corey Skains, MD[x] Hilbert Odor, PA-C  [] Hover for details [facility] Cardiology  CARDIOLOGY CONSULT NOTE  Patient ID: Timothy Mcmahon MRN: [id number] DOB/AGE: [birth year] 78 y.o.  Admit date: 04/13/2018 Referring Physician Henreitta Leber, MD Primary Physician Coy Saunas, MD Primary Cardiologist Dimas Alexandria, MD Reason for Consultation Pre-syncope  HPI:  Timothy Mcmahon is a 78 y.o. with a past medical history of coronary artery disease s/p PCI with stent to mid LAD 03/18, peripheral vascular disease s/p left SFA stenting, and carotid artery stenosis s/p endarterectomy on left side with TCAR on right, COPD, who presented to the ED yesterday morning after having two near syncopal episodes at work. He reports walking when abruptly becoming lightheaded with tunnel vision. He did not lose consciousness or fall as his coworker caught him. He has had several of these lightheadedness and near syncopal episodes in the last few weeks. He denies having any associated palpitations, chest pain, or other symptoms along with the pre-syncope. Does report to frequent leg pain in the last few days. Pain occurs after walking 50-100 feet and is described as a burning sensation in both of his legs. This pain is similar to pain he felt prior to needing stents in his left leg. He has not seen his vascular surgeon in over one year. Has been unable to have his normal follow up due to appointment limitations from coronavirus.  He has an implantable cardiac loop monitor device by [employer]. His reports are followed by his cardiologist, Dr. Dwyane Dee with [facility]. I spoke to Dr. Dwyane Dee, who reports the patient has had several asymptomatic pauses, lasting approximately 3 seconds, recorded on his loop monitor.  The last pause was recorded was on 04/10/18. During the patient's symptomatic episodes, his rhythm has been sinus without any events noted. Events are sent in via a home device every night at midnight, so any events that may have occurred yesterday morning along with the patient's symptoms have not been transcribed as the patient has been away from his home device last night.   Review of systems complete and found to be negative unless listed above       Past Medical History:  Diagnosis Date   CAD (coronary artery disease)    COPD (chronic obstructive pulmonary disease) ([place])    Hyperlipidemia    Peripheral vascular disease ([place])     History reviewed. No pertinent surgical history.         Medications Prior to Admission  Medication Sig Dispense Refill Last Dose   albuterol (PROVENTIL HFA;VENTOLIN HFA) 108 (90 Base) MCG/ACT inhaler Inhale 1-2 puffs into the lungs every 6 (six) hours as needed for wheezing.   prn at prn   albuterol (PROVENTIL) (2.5 MG/3ML) 0.083% nebulizer solution Take 3 mLs by nebulization 2 (two) times daily.   prn at prn   atorvastatin (LIPITOR) 80 MG tablet Take 80 mg by mouth daily.   04/13/2018 at 0800   budesonide-formoterol (SYMBICORT) 160-4.5 MCG/ACT inhaler Inhale 2 puffs into the lungs 2 (two) times daily.   04/13/2018 at 0800   clopidogrel (PLAVIX) 75 MG tablet Take 75 mg by mouth daily.   04/13/2018 at 0800   ibuprofen (ADVIL,MOTRIN) 200 MG tablet Take 600 mg by mouth 2 (two) times  daily.   04/13/2018 at 0800   lisinopril (PRINIVIL,ZESTRIL) 5 MG tablet Take 5 mg by mouth daily.   04/13/2018 at 0800   metoprolol tartrate (LOPRESSOR) 25 MG tablet Take 25 mg by mouth 2 (two) times daily.   04/13/2018 at 0800   omeprazole (PRILOSEC) 20 MG capsule Take 20 mg by mouth daily.   04/13/2018 at 0800   Sodium Chloride, Inhalant, 7 % NEBU Take 4 mLs by nebulization 2 (two) times daily.   prn at prn   tamsulosin (FLOMAX) 0.4 MG CAPS capsule  Take 0.4 mg by mouth daily.   04/13/2018 at 0800   Tiotropium Bromide Monohydrate (SPIRIVA RESPIMAT) 2.5 MCG/ACT AERS Inhale 2 puffs into the lungs daily.   04/13/2018 at 0800   traZODone (DESYREL) 100 MG tablet Take 100 mg by mouth at bedtime.   04/12/2018 at 2000   Social History        Socioeconomic History   Marital status: Married    Spouse name: Not on file   Number of children: Not on file   Years of education: Not on file   Highest education level: Not on file  Occupational History   Not on file  Social Needs   Financial resource strain: Not on file   Food insecurity:    Worry: Not on file    Inability: Not on file   Transportation needs:    Medical: Not on file    Non-medical: Not on file  Tobacco Use   Smoking status: Current Every Day Smoker    Packs/day: 0.75    Years: 52.00    Pack years: 39.00    Types: Cigarettes  Substance and Sexual Activity   Alcohol use: Not Currently   Drug use: Not Currently   Sexual activity: Not on file  Lifestyle   Physical activity:    Days per week: Not on file    Minutes per session: Not on file   Stress: Not on file  Relationships   Social connections:    Talks on phone: Not on file    Gets together: Not on file    Attends religious service: Not on file    Active member of club or organization: Not on file    Attends meetings of clubs or organizations: Not on file    Relationship status: Not on file   Intimate partner violence:    Fear of current or ex partner: Not on file    Emotionally abused: Not on file    Physically abused: Not on file    Forced sexual activity: Not on file  Other Topics Concern   Not on file  Social History Narrative   Not on file         Family History  Problem Relation Age of Onset   Heart attack Mother    Cancer Brother       Review of systems complete and found to be negative unless listed above     PHYSICAL EXAM  General: Well developed, well nourished, in no acute distress HEENT:  Normocephalic and atramatic Neck:   No JVD.  Lungs: Breathing comfortably on room air. Diffuse wheezing heard throughout. No crackles.  Heart: HRRR . Normal S1 and S2 without gallops or murmurs.  Abdomen: Bowel sounds are positive, abdomen soft and non-tender  Msk: Normal tone for age. Extremities: No clubbing, cyanosis or edema.   Neuro: Alert and oriented X 3. Psych:  Good affect, responds appropriately  Labs:   Recent Labs  Lab Results  Component Value Date   WBC 9.4 04/13/2018   HGB 12.7 (L) 04/13/2018   HCT 38.6 (L) 04/13/2018   MCV 96.5 04/13/2018   PLT 142 (L) 04/13/2018      Last Labs      Recent Labs  Lab 04/13/18 1105  NA 138  K 3.0*  CL 107  CO2 21*  BUN 23  CREATININE 1.40*  CALCIUM 8.5*  PROT 6.6  BILITOT 0.6  ALKPHOS 92  ALT 16  AST 18  GLUCOSE 105*     Recent Labs       Lab Results  Component Value Date   TROPONINI 0.03 (HH) 04/14/2018      Recent Labs  No results found for: CHOL   Recent Labs  No results found for: HDL   Recent Labs  No results found for: LDLCALC   Recent Labs  No results found for: TRIG   Recent Labs  No results found for: CHOLHDL   Recent Labs  No results found for: LDLDIRECT      Radiology:  Imaging Results  Ct Angio Head W Or Wo Contrast  Result Date: 04/13/2018 CLINICAL DATA:  Persistent central vertigo. EXAM: CT ANGIOGRAPHY HEAD AND NECK TECHNIQUE: Multidetector CT imaging of the head and neck was performed using the standard protocol during bolus administration of intravenous contrast. Multiplanar CT image reconstructions and MIPs were obtained to evaluate the vascular anatomy. Carotid stenosis measurements (when applicable) are obtained utilizing NASCET criteria, using the distal internal carotid diameter as the denominator. CONTRAST:  24mL OMNIPAQUE IOHEXOL 350 MG/ML SOLN COMPARISON:  CTA head  05/01/2016. FINDINGS: CT HEAD FINDINGS Brain: No evidence of acute infarction, hemorrhage, hydrocephalus, extra-axial collection or mass lesion/mass effect. Vascular: Reported separately. Skull: Normal. Negative for fracture or focal lesion. Sinuses: Imaged portions are clear. Orbits: No acute finding. Review of the MIP images confirms the above findings CTA NECK FINDINGS Aortic arch: Variant branching, LEFT vertebral arises directly from arch imaged portion shows no evidence of aneurysm or dissection. No significant stenosis of the innominate, common carotid, or RIGHT subclavian origins. 75-90% stenosis proximal LEFT subclavian. Aortic atherosclerosis. Right carotid system: Minor atheromatous change RIGHT common carotid origin. RIGHT carotid stent extends from the common carotid artery across the bifurcation into the proximal ICA. IntraStent stenosis in the proximal RIGHT ICA, of 50% based on luminal diameter of 2.0/4.0 proximal/distal. See series 8, image 102. Left carotid system: No evidence of dissection, stenosis (50% or greater) or occlusion, status post LEFT carotid endarterectomy. Vertebral arteries: 50% ostial stenosis on the RIGHT. No ostial stenosis on the LEFT. Vertebrals are patent throughout the neck, with a RIGHT mildly dominant. Skeleton: No worrisome osseous lesion.  C6-C7 fusion. Other neck: No masses. Upper chest: Azygos lobe. Scarring and emphysema. Fluid and calcification in the azygos fissure. Review of the MIP images confirms the above findings CTA HEAD FINDINGS Anterior circulation: Calcification of the cavernous internal carotid arteries consistent with cerebrovascular atherosclerotic disease. Estimated BILATERAL calcific siphon stenoses are 50-75%, stable from priors. No signs of intracranial large vessel occlusion. Posterior circulation: RIGHT vertebral dominant. LEFT vertebral supplies PICA, with rudimentary basilar connection. There is at estimated 50% stenosis of the proximal basilar.  Distal basilar appears normal caliber. No cerebellar branch occlusion. Venous sinuses: As permitted by contrast timing, patent. Anatomic variants: None of significance. Delayed phase: No abnormal postcontrast enhancement. Review of the MIP images confirms the above findings IMPRESSION: Status post LEFT carotid endarterectomy, widely patent. IntraStent stenosis, proximal RIGHT ICA, 50%.  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 Non dominant LEFT vertebral, arising from the arch, widely patent. 50% stenosis  dominant RIGHT vertebral, stable from priors. 50% stenosis proximal basilar, stable from priors. 50-75% calcific stenosis both carotid siphons, stable from priors. Electronically Signed   By: Staci Righter M.D.   On: 04/13/2018 13:20   Dg Chest 2 View  Result Date: 04/13/2018 CLINICAL DATA:  Orthostatic hypotension. EXAM: CHEST - 2 VIEW COMPARISON:  CT chest dated July 04, 2017. Chest x-ray dated May 01, 2016. FINDINGS: New loop recorder. The heart size and mediastinal contours are within normal limits. Normal pulmonary vascularity. 8 mm nodular density in the left upper lobe appears slightly increased when compared to prior chest CT. Minimal left basilar atelectasis. No focal consolidation, pleural effusion, or pneumothorax. No acute osseous abnormality. IMPRESSION: 1. 8 mm nodular density in the left upper lobe appears slightly increased when compared to prior chest CT. Further evaluation with non-emergent chest CT is recommended. 2.  No active cardiopulmonary disease. Electronically Signed   By: Titus Dubin M.D.   On: 04/13/2018 12:35   Ct Angio Neck W And/or Wo Contrast  Result Date: 04/13/2018 CLINICAL DATA:  Persistent central vertigo. EXAM: CT ANGIOGRAPHY HEAD AND NECK TECHNIQUE: Multidetector CT imaging of the head and neck was performed using the standard protocol during bolus administration of intravenous contrast. Multiplanar CT image  reconstructions and MIPs were obtained to evaluate the vascular anatomy. Carotid stenosis measurements (when applicable) are obtained utilizing NASCET criteria, using the distal internal carotid diameter as the denominator. CONTRAST:  49mL OMNIPAQUE IOHEXOL 350 MG/ML SOLN COMPARISON:  CTA head 05/01/2016. FINDINGS: CT HEAD FINDINGS Brain: No evidence of acute infarction, hemorrhage, hydrocephalus, extra-axial collection or mass lesion/mass effect. Vascular: Reported separately. Skull: Normal. Negative for fracture or focal lesion. Sinuses: Imaged portions are clear. Orbits: No acute finding. Review of the MIP images confirms the above findings CTA NECK FINDINGS Aortic arch: Variant branching, LEFT vertebral arises directly from arch imaged portion shows no evidence of aneurysm or dissection. No significant stenosis of the innominate, common carotid, or RIGHT subclavian origins. 75-90% stenosis proximal LEFT subclavian. Aortic atherosclerosis. Right carotid system: Minor atheromatous change RIGHT common carotid origin. RIGHT carotid stent extends from the common carotid artery across the bifurcation into the proximal ICA. IntraStent stenosis in the proximal RIGHT ICA, of 50% based on luminal diameter of 2.0/4.0 proximal/distal. See series 8, image 102. Left carotid system: No evidence of dissection, stenosis (50% or greater) or occlusion, status post LEFT carotid endarterectomy. Vertebral arteries: 50% ostial stenosis on the RIGHT. No ostial stenosis on the LEFT. Vertebrals are patent throughout the neck, with a RIGHT mildly dominant. Skeleton: No worrisome osseous lesion.  C6-C7 fusion. Other neck: No masses. Upper chest: Azygos lobe. Scarring and emphysema. Fluid and calcification in the azygos fissure. Review of the MIP images confirms the above findings CTA HEAD FINDINGS Anterior circulation: Calcification of the cavernous internal carotid arteries consistent with cerebrovascular atherosclerotic disease.  Estimated BILATERAL calcific siphon stenoses are 50-75%, stable from priors. No signs of intracranial large vessel occlusion. Posterior circulation: RIGHT vertebral dominant. LEFT vertebral supplies PICA, with rudimentary basilar connection. There is at estimated 50% stenosis of the proximal basilar. Distal basilar appears normal caliber. No cerebellar branch occlusion. Venous sinuses: As permitted by contrast timing, patent. Anatomic variants: None of significance. Delayed phase: No abnormal postcontrast enhancement. Review of the MIP images confirms the above findings IMPRESSION: Status post LEFT carotid endarterectomy, widely  patent. IntraStent stenosis, proximal RIGHT ICA, 50%. 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 Non dominant LEFT vertebral, arising from the arch, widely patent. 50% stenosis  dominant RIGHT vertebral, stable from priors. 50% stenosis proximal basilar, stable from priors. 50-75% calcific stenosis both carotid siphons, stable from priors. Electronically Signed   By: Staci Righter M.D.   On: 04/13/2018 13:20    ASSESSMENT AND PLAN:  Mr. Baltasar is a 78 year old male with a history of CAD s/p PCI and stent to LAD in 2018, peripheral vascular disease with left SFA stenting, and left carotid endarterectomy, who presents after having a near syncopal episodes. No true syncope. Symptoms not associated with palpitations, chest pain, or other concerning symptoms. Have been unable to obtain implantable loop monitoring device report due to patient being away from his bedside device last night. Will attempt to contact [employer] rep for inpatient interrogation, however, patient has no evidence of an acute cardiac event that would require further emergent evaluation at this time. Telemetry review without events during his hospitalization. No evidence of heart failure or myocardial ischemia. Troponin x3 negative. CXR without acute pathology. Would not  recommend any further cardiac diagnostics at this time. Would recommend close outpatient cardiology follow up for further evaluation.   Signed: Hilbert Odor PA-C 04/14/2018, 8:31 AM   Agree with above Serafina Royals md facc        Electronically signed by Hilbert Odor, PA-C at 04/14/2018 9:54 AM Electronically signed by Corey Skains, MD at 04/14/2018 1:35 PM Electronically signed by Corey Skains, MD at 04/14/2018 1:35 PM     ED to Hosp-Admission (Discharged) on 04/13/2018       Revision & Routing History       Detailed Report

## 2018-04-30 NOTE — Anesthesia Post-op Follow-up Note (Signed)
Anesthesia QCDR form completed.        

## 2018-04-30 NOTE — Consult Note (Signed)
Timothy Skains, MD  Physician  Cardiology  Consult Note  Addendum  Date of Service:  04/14/2018 8:29 AM          Expand All Collapse All    Show:Clear all [x] Manual[x] Template[x] Copied  Added by: [x] Timothy Skains, MD[x] Hilbert Odor, PA-C  [] Hover for details [facility] Cardiology  CARDIOLOGY CONSULT NOTE  Patient ID: Timothy Mcmahon MRN: [id number] DOB/AGE: Mar 06, 1940 78 y.o.  Admit date: 04/13/2018 Referring Physician Henreitta Leber, MD Primary Physician Coy Saunas, MD Primary Cardiologist Dimas Alexandria, MD Reason for Consultation Pre-syncope  HPI:  Timothy Mcmahon is a 78 y.o. with a past medical history of coronary artery disease s/p PCI with stent to mid LAD 03/18, peripheral vascular disease s/p left SFA stenting, and carotid artery stenosis s/p endarterectomy on left side with TCAR on right, COPD, who presented to the ED yesterday morning after having two near syncopal episodes at work. He reports walking when abruptly becoming lightheaded with tunnel vision. He did not lose consciousness or fall as his coworker caught him. He has had several of these lightheadedness and near syncopal episodes in the last few weeks. He denies having any associated palpitations, chest pain, or other symptoms along with the pre-syncope. Does report to frequent leg pain in the last few days. Pain occurs after walking 50-100 feet and is described as a burning sensation in both of his legs. This pain is similar to pain he felt prior to needing stents in his left leg. He has not seen his vascular surgeon in over one year. Has been unable to have his normal follow up due to appointment limitations from coronavirus.  He has an implantable cardiac loop monitor device by [employer]. His reports are followed by his cardiologist, Dr. Dwyane Dee with [facility]. I spoke to Dr. Dwyane Dee, who reports the patient has had several asymptomatic pauses, lasting approximately 3 seconds, recorded on his loop monitor.  The last pause was recorded was on 04/10/18. During the patient's symptomatic episodes, his rhythm has been sinus without any events noted. Events are sent in via a home device every night at midnight, so any events that may have occurred yesterday morning along with the patient's symptoms have not been transcribed as the patient has been away from his home device last night.   Review of systems complete and found to be negative unless listed above       Past Medical History:  Diagnosis Date   CAD (coronary artery disease)    COPD (chronic obstructive pulmonary disease) ([place])    Hyperlipidemia    Peripheral vascular disease ([place])     History reviewed. No pertinent surgical history.         Medications Prior to Admission  Medication Sig Dispense Refill Last Dose   albuterol (PROVENTIL HFA;VENTOLIN HFA) 108 (90 Base) MCG/ACT inhaler Inhale 1-2 puffs into the lungs every 6 (six) hours as needed for wheezing.   prn at prn   albuterol (PROVENTIL) (2.5 MG/3ML) 0.083% nebulizer solution Take 3 mLs by nebulization 2 (two) times daily.   prn at prn   atorvastatin (LIPITOR) 80 MG tablet Take 80 mg by mouth daily.   04/13/2018 at 0800   budesonide-formoterol (SYMBICORT) 160-4.5 MCG/ACT inhaler Inhale 2 puffs into the lungs 2 (two) times daily.   04/13/2018 at 0800   clopidogrel (PLAVIX) 75 MG tablet Take 75 mg by mouth daily.   04/13/2018 at 0800   ibuprofen (ADVIL,MOTRIN) 200 MG tablet Take 600 mg by mouth 2 (two) times  daily.   04/13/2018 at 0800   lisinopril (PRINIVIL,ZESTRIL) 5 MG tablet Take 5 mg by mouth daily.   04/13/2018 at 0800   metoprolol tartrate (LOPRESSOR) 25 MG tablet Take 25 mg by mouth 2 (two) times daily.   04/13/2018 at 0800   omeprazole (PRILOSEC) 20 MG capsule Take 20 mg by mouth daily.   04/13/2018 at 0800   Sodium Chloride, Inhalant, 7 % NEBU Take 4 mLs by nebulization 2 (two) times daily.   prn at prn   tamsulosin (FLOMAX) 0.4 MG CAPS capsule  Take 0.4 mg by mouth daily.   04/13/2018 at 0800   Tiotropium Bromide Monohydrate (SPIRIVA RESPIMAT) 2.5 MCG/ACT AERS Inhale 2 puffs into the lungs daily.   04/13/2018 at 0800   traZODone (DESYREL) 100 MG tablet Take 100 mg by mouth at bedtime.   04/12/2018 at 2000   Social History        Socioeconomic History   Marital status: Married    Spouse name: Not on file   Number of children: Not on file   Years of education: Not on file   Highest education level: Not on file  Occupational History   Not on file  Social Needs   Financial resource strain: Not on file   Food insecurity:    Worry: Not on file    Inability: Not on file   Transportation needs:    Medical: Not on file    Non-medical: Not on file  Tobacco Use   Smoking status: Current Every Day Smoker    Packs/day: 0.75    Years: 52.00    Pack years: 39.00    Types: Cigarettes  Substance and Sexual Activity   Alcohol use: Not Currently   Drug use: Not Currently   Sexual activity: Not on file  Lifestyle   Physical activity:    Days per week: Not on file    Minutes per session: Not on file   Stress: Not on file  Relationships   Social connections:    Talks on phone: Not on file    Gets together: Not on file    Attends religious service: Not on file    Active member of club or organization: Not on file    Attends meetings of clubs or organizations: Not on file    Relationship status: Not on file   Intimate partner violence:    Fear of current or ex partner: Not on file    Emotionally abused: Not on file    Physically abused: Not on file    Forced sexual activity: Not on file  Other Topics Concern   Not on file  Social History Narrative   Not on file         Family History  Problem Relation Age of Onset   Heart attack Mother    Cancer Brother       Review of systems complete and found to be negative unless listed above     PHYSICAL EXAM  General: Well developed, well nourished, in no acute distress HEENT:  Normocephalic and atramatic Neck:   No JVD.  Lungs: Breathing comfortably on room air. Diffuse wheezing heard throughout. No crackles.  Heart: HRRR . Normal S1 and S2 without gallops or murmurs.  Abdomen: Bowel sounds are positive, abdomen soft and non-tender  Msk: Normal tone for age. Extremities: No clubbing, cyanosis or edema.   Neuro: Alert and oriented X 3. Psych:  Good affect, responds appropriately  Labs:   Recent Labs  Lab Results  Component Value Date   WBC 9.4 04/13/2018   HGB 12.7 (L) 04/13/2018   HCT 38.6 (L) 04/13/2018   MCV 96.5 04/13/2018   PLT 142 (L) 04/13/2018      Last Labs      Recent Labs  Lab 04/13/18 1105  NA 138  K 3.0*  CL 107  CO2 21*  BUN 23  CREATININE 1.40*  CALCIUM 8.5*  PROT 6.6  BILITOT 0.6  ALKPHOS 92  ALT 16  AST 18  GLUCOSE 105*     Recent Labs       Lab Results  Component Value Date   TROPONINI 0.03 (HH) 04/14/2018      Recent Labs  No results found for: CHOL   Recent Labs  No results found for: HDL   Recent Labs  No results found for: LDLCALC   Recent Labs  No results found for: TRIG   Recent Labs  No results found for: CHOLHDL   Recent Labs  No results found for: LDLDIRECT      Radiology:  Imaging Results  Ct Angio Head W Or Wo Contrast  Result Date: 04/13/2018 CLINICAL DATA:  Persistent central vertigo. EXAM: CT ANGIOGRAPHY HEAD AND NECK TECHNIQUE: Multidetector CT imaging of the head and neck was performed using the standard protocol during bolus administration of intravenous contrast. Multiplanar CT image reconstructions and MIPs were obtained to evaluate the vascular anatomy. Carotid stenosis measurements (when applicable) are obtained utilizing NASCET criteria, using the distal internal carotid diameter as the denominator. CONTRAST:  69mL OMNIPAQUE IOHEXOL 350 MG/ML SOLN COMPARISON:  CTA head  05/01/2016. FINDINGS: CT HEAD FINDINGS Brain: No evidence of acute infarction, hemorrhage, hydrocephalus, extra-axial collection or mass lesion/mass effect. Vascular: Reported separately. Skull: Normal. Negative for fracture or focal lesion. Sinuses: Imaged portions are clear. Orbits: No acute finding. Review of the MIP images confirms the above findings CTA NECK FINDINGS Aortic arch: Variant branching, LEFT vertebral arises directly from arch imaged portion shows no evidence of aneurysm or dissection. No significant stenosis of the innominate, common carotid, or RIGHT subclavian origins. 75-90% stenosis proximal LEFT subclavian. Aortic atherosclerosis. Right carotid system: Minor atheromatous change RIGHT common carotid origin. RIGHT carotid stent extends from the common carotid artery across the bifurcation into the proximal ICA. IntraStent stenosis in the proximal RIGHT ICA, of 50% based on luminal diameter of 2.0/4.0 proximal/distal. See series 8, image 102. Left carotid system: No evidence of dissection, stenosis (50% or greater) or occlusion, status post LEFT carotid endarterectomy. Vertebral arteries: 50% ostial stenosis on the RIGHT. No ostial stenosis on the LEFT. Vertebrals are patent throughout the neck, with a RIGHT mildly dominant. Skeleton: No worrisome osseous lesion.  C6-C7 fusion. Other neck: No masses. Upper chest: Azygos lobe. Scarring and emphysema. Fluid and calcification in the azygos fissure. Review of the MIP images confirms the above findings CTA HEAD FINDINGS Anterior circulation: Calcification of the cavernous internal carotid arteries consistent with cerebrovascular atherosclerotic disease. Estimated BILATERAL calcific siphon stenoses are 50-75%, stable from priors. No signs of intracranial large vessel occlusion. Posterior circulation: RIGHT vertebral dominant. LEFT vertebral supplies PICA, with rudimentary basilar connection. There is at estimated 50% stenosis of the proximal basilar.  Distal basilar appears normal caliber. No cerebellar branch occlusion. Venous sinuses: As permitted by contrast timing, patent. Anatomic variants: None of significance. Delayed phase: No abnormal postcontrast enhancement. Review of the MIP images confirms the above findings IMPRESSION: Status post LEFT carotid endarterectomy, widely patent. IntraStent stenosis, proximal RIGHT ICA, 50%.  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 Non dominant LEFT vertebral, arising from the arch, widely patent. 50% stenosis  dominant RIGHT vertebral, stable from priors. 50% stenosis proximal basilar, stable from priors. 50-75% calcific stenosis both carotid siphons, stable from priors. Electronically Signed   By: Staci Righter M.D.   On: 04/13/2018 13:20   Dg Chest 2 View  Result Date: 04/13/2018 CLINICAL DATA:  Orthostatic hypotension. EXAM: CHEST - 2 VIEW COMPARISON:  CT chest dated July 04, 2017. Chest x-ray dated May 01, 2016. FINDINGS: New loop recorder. The heart size and mediastinal contours are within normal limits. Normal pulmonary vascularity. 8 mm nodular density in the left upper lobe appears slightly increased when compared to prior chest CT. Minimal left basilar atelectasis. No focal consolidation, pleural effusion, or pneumothorax. No acute osseous abnormality. IMPRESSION: 1. 8 mm nodular density in the left upper lobe appears slightly increased when compared to prior chest CT. Further evaluation with non-emergent chest CT is recommended. 2.  No active cardiopulmonary disease. Electronically Signed   By: Titus Dubin M.D.   On: 04/13/2018 12:35   Ct Angio Neck W And/or Wo Contrast  Result Date: 04/13/2018 CLINICAL DATA:  Persistent central vertigo. EXAM: CT ANGIOGRAPHY HEAD AND NECK TECHNIQUE: Multidetector CT imaging of the head and neck was performed using the standard protocol during bolus administration of intravenous contrast. Multiplanar CT image  reconstructions and MIPs were obtained to evaluate the vascular anatomy. Carotid stenosis measurements (when applicable) are obtained utilizing NASCET criteria, using the distal internal carotid diameter as the denominator. CONTRAST:  69mL OMNIPAQUE IOHEXOL 350 MG/ML SOLN COMPARISON:  CTA head 05/01/2016. FINDINGS: CT HEAD FINDINGS Brain: No evidence of acute infarction, hemorrhage, hydrocephalus, extra-axial collection or mass lesion/mass effect. Vascular: Reported separately. Skull: Normal. Negative for fracture or focal lesion. Sinuses: Imaged portions are clear. Orbits: No acute finding. Review of the MIP images confirms the above findings CTA NECK FINDINGS Aortic arch: Variant branching, LEFT vertebral arises directly from arch imaged portion shows no evidence of aneurysm or dissection. No significant stenosis of the innominate, common carotid, or RIGHT subclavian origins. 75-90% stenosis proximal LEFT subclavian. Aortic atherosclerosis. Right carotid system: Minor atheromatous change RIGHT common carotid origin. RIGHT carotid stent extends from the common carotid artery across the bifurcation into the proximal ICA. IntraStent stenosis in the proximal RIGHT ICA, of 50% based on luminal diameter of 2.0/4.0 proximal/distal. See series 8, image 102. Left carotid system: No evidence of dissection, stenosis (50% or greater) or occlusion, status post LEFT carotid endarterectomy. Vertebral arteries: 50% ostial stenosis on the RIGHT. No ostial stenosis on the LEFT. Vertebrals are patent throughout the neck, with a RIGHT mildly dominant. Skeleton: No worrisome osseous lesion.  C6-C7 fusion. Other neck: No masses. Upper chest: Azygos lobe. Scarring and emphysema. Fluid and calcification in the azygos fissure. Review of the MIP images confirms the above findings CTA HEAD FINDINGS Anterior circulation: Calcification of the cavernous internal carotid arteries consistent with cerebrovascular atherosclerotic disease.  Estimated BILATERAL calcific siphon stenoses are 50-75%, stable from priors. No signs of intracranial large vessel occlusion. Posterior circulation: RIGHT vertebral dominant. LEFT vertebral supplies PICA, with rudimentary basilar connection. There is at estimated 50% stenosis of the proximal basilar. Distal basilar appears normal caliber. No cerebellar branch occlusion. Venous sinuses: As permitted by contrast timing, patent. Anatomic variants: None of significance. Delayed phase: No abnormal postcontrast enhancement. Review of the MIP images confirms the above findings IMPRESSION: Status post LEFT carotid endarterectomy, widely  patent. IntraStent stenosis, proximal RIGHT ICA, 50%. 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 Non dominant LEFT vertebral, arising from the arch, widely patent. 50% stenosis  dominant RIGHT vertebral, stable from priors. 50% stenosis proximal basilar, stable from priors. 50-75% calcific stenosis both carotid siphons, stable from priors. Electronically Signed   By: Staci Righter M.D.   On: 04/13/2018 13:20    ASSESSMENT AND PLAN:  Mr. Cordel is a 78 year old male with a history of CAD s/p PCI and stent to LAD in 2018, peripheral vascular disease with left SFA stenting, and left carotid endarterectomy, who presents after having a near syncopal episodes. No true syncope. Symptoms not associated with palpitations, chest pain, or other concerning symptoms. Have been unable to obtain implantable loop monitoring device report due to patient being away from his bedside device last night. Will attempt to contact [employer] rep for inpatient interrogation, however, patient has no evidence of an acute cardiac event that would require further emergent evaluation at this time. Telemetry review without events during his hospitalization. No evidence of heart failure or myocardial ischemia. Troponin x3 negative. CXR without acute pathology. Would not  recommend any further cardiac diagnostics at this time. Would recommend close outpatient cardiology follow up for further evaluation.   Signed: Hilbert Odor PA-C 04/14/2018, 8:31 AM   Agree with above Serafina Royals md facc        Electronically signed by Hilbert Odor, PA-C at 04/14/2018 9:54 AM Electronically signed by Timothy Skains, MD at 04/14/2018 1:35 PM Electronically signed by Timothy Skains, MD at 04/14/2018 1:35 PM     ED to Hosp-Admission (Discharged) on 04/13/2018       Revision & Routing History       Detailed Report

## 2018-04-30 NOTE — Op Note (Signed)
[facility] Card[facility]   04/30/2018                     3:17 PM  PATIENT:  Timothy Mcmahon    PRE-OPERATIVE DIAGNOSIS:  SSS / BRADYCARDIA SYNCOPE  POST-OPERATIVE DIAGNOSIS:  Same  PROCEDURE:  INSERTION PACEMAKER DUAL CHAMBER  SURGEON:  Isaias Cowman, MD    ANESTHESIA:     PREOPERATIVE INDICATIONS:  Timothy Mcmahon is a  78 y.o. male with a diagnosis of SSS / BRADYCARDIA SYNCOPE who failed conservative measures and elected for surgical management.    The risks benefits and alternatives were discussed with the patient preoperatively including but not limited to the risks of infection, bleeding, cardiopulmonary complications, the need for revision surgery, among others, and the patient was willing to proceed.   OPERATIVE PROCEDURE: The patient was brought to the operating room in a fasting state.  The left pectoral region was prepped and draped in usual sterile manner.  Anesthesia was obtained 1% lidocaine locally.  A 6 cm incision was performed the left pectoral region.  Pacemaker pocket was generated electrocautery and blunt dissection.  Access was obtained to the left subclavian vein by fine-needle aspiration.  MRI compatible leads were positioned to right ventricular apical septum ( [employer] [id number] ) and right atrial appendage under fluoroscopic guidance ( [employer] [id number] ).  After proper thresholds were obtained the leads were sutured in place.  The leads were connected to an MRI compatible dual-chamber rate responsive pacemaker generator ( [employer]onic [id number] ).  The pacemaker pocket was irrigated gentamicin solution.  The pacemaker generator was positioned into the pocket and the pocket was closed with 2-0 and 4-0 Vicryl, respectively.  Steri-Strips and pressure dressing were applied.  There were no periprocedural complications.  Postprocedural interrogation revealed appropriate dual-chamber atrial and ventricular sensing and pacing thresholds.

## 2018-04-30 NOTE — Anesthesia Postprocedure Evaluation (Signed)
Anesthesia Post Note  Patient: Timothy Mcmahon  Procedure(s) Performed: INSERTION PACEMAKER DUAL CHAMBER (N/A )  Patient location during evaluation: PACU Anesthesia Type: General Level of consciousness: awake and alert Pain management: pain level controlled Vital Signs Assessment: post-procedure vital signs reviewed and stable Respiratory status: spontaneous breathing, nonlabored ventilation, respiratory function stable and patient connected to nasal cannula oxygen Cardiovascular status: blood pressure returned to baseline and stable Postop Assessment: no apparent nausea or vomiting Anesthetic complications: no     Last Vitals:  Vitals:   04/30/18 1635 04/30/18 1650  BP: (!) 178/87 (!) 188/89  Pulse: 72 75  Resp: 13 18  Temp:  36.5 C  SpO2: 96% 98%    Last Pain:  Vitals:   04/30/18 1756  TempSrc:   PainSc: 7                  Teryl,I[place]pa Geffre S

## 2018-04-30 NOTE — Anesthesia Preprocedure Evaluation (Signed)
Anesthesia Evaluation  Patient identified by MRN, date of birth, ID band Patient awake    Reviewed: Allergy & Precautions, NPO status , Patient's Chart, lab work & pertinent test results  History of Anesthesia Complications Negative for: history of anesthetic complications  Airway Mallampati: II  TM Distance: >3 FB Neck ROM: Full    Dental  (+) Upper Dentures, Lower Dentures   Pulmonary neg sleep apnea, COPD,  COPD inhaler, Current Smoker,    breath sounds clear to auscultation- rhonchi (-) wheezing      Cardiovascular + CAD, + Cardiac Stents and + Peripheral Vascular Disease  (-) CABG + dysrhythmias (SSS)  Rhythm:Regular Rate:Normal - Systolic murmurs and - Diastolic murmurs    Neuro/Psych neg Seizures negative neurological ROS  negative psych ROS   GI/Hepatic negative GI ROS, Neg liver ROS,   Endo/Other  negative endocrine ROSneg diabetes  Renal/GU negative Renal ROS     Musculoskeletal negative musculoskeletal ROS (+)   Abdominal (+) - obese,   Peds  Hematology negative hematology ROS (+)   Anesthesia Other Findings Past Medical History: No date: CAD (coronary artery disease) No date: COPD (chronic obstructive pulmonary disease) ([facility]) No date: Hyperlipidemia No date: Peripheral vascular disease ([facility])   Reproductive/Obstetrics                             Anesthesia Physical Anesthesia Plan  ASA: III  Anesthesia Plan: General   Post-op Pain Management:    Induction: Intravenous  PONV Risk Score and Plan: 0 and Propofol infusion  Airway Management Planned: Natural Airway  Additional Equipment:   Intra-op Plan:   Post-operative Plan:   Informed Consent: I have reviewed the patients History and Physical, chart, labs and discussed the procedure including the risks, benefits and alternatives for the proposed anesthesia with the patient or authorized representative who has  indicated his/her understanding and acceptance.     Dental advisory given  Plan Discussed with: CRNA and Anesthesiologist  Anesthesia Plan Comments:         Anesthesia Quick Evaluation

## 2018-05-01 ENCOUNTER — Encounter: Payer: Self-pay | Admitting: Cardiology

## 2018-05-01 DIAGNOSIS — R55 Syncope and collapse: Secondary | ICD-10-CM | POA: Diagnosis not present

## 2018-05-01 DIAGNOSIS — I495 Sick sinus syndrome: Secondary | ICD-10-CM

## 2018-05-01 MED ORDER — SODIUM CHLORIDE 0.9 % IV SOLN
INTRAVENOUS | Status: DC | PRN
  Administered 2018-05-01: 10 mL via INTRAVENOUS

## 2018-05-01 NOTE — Discharge Instructions (Signed)
Pacemaker Implantation, Adult, Care After  This sheet gives you information about how to care for yourself after your procedure. Your health care provider may also give you more specific instructions. If you have problems or questions, contact your health care provider.  What can I expect after the procedure?  After the procedure, it is common to have:   Mild pain.   Slight bruising.   Some swelling over the incision.   A slight bump over the skin where the device was placed. Sometimes, it is possible to feel the device under the skin. This is normal.  Follow these instructions at home:  Medicines   Take over-the-counter and prescription medicines only as told by your health care provider.   If you were prescribed an antibiotic medicine, take it as told by your health care provider. Do not stop taking the antibiotic even if you start to feel better.  Wound care     Do not remove the bandage on your chest until directed to do so by your health care provider.   After your bandage is removed, you may see pieces of tape called skin adhesive strips over the area where the cut was made (incision site). Let them fall off on their own.   Check the incision site every day to make sure it is not infected, bleeding, or starting to pull apart.   Do not use lotions or ointments near the incision site unless directed to do so.   Keep the incision area clean and dry for 2-3 days after the procedure or as directed by your health care provider. It takes several weeks for the incision site to completely heal.   Do not take baths, swim, or use a hot tub for 7-10 days or as otherwise directed by your health care provider.  Activity   Do not drive or use heavy machinery while taking prescription pain medicine.   Do not drive for 24 hours if you were given a medicine to help you relax (sedative).   Check with your health care provider before you start to drive or play sports.   Avoid sudden jerking, pulling, or chopping  movements that pull your upper arm far away from your body. Avoid these movements for at least 6 weeks or as long as told by your health care provider.   Do not lift your upper arm above your shoulders for at least 6 weeks or as long as told by your health care provider. This means no tennis, golf, or swimming.   You may go back to work when your health care provider says it is okay.  Pacemaker care   You may be shown how to transfer data from your pacemaker through the phone to your health care provider.   Always let all health care providers know about your pacemaker before you have any medical procedures or tests.   Wear a medical ID bracelet or necklace stating that you have a pacemaker. Carry a pacemaker ID card with you at all times.   Your pacemaker battery will last for 5-15 years. Routine checks by your health care provider will let the health care provider know when the battery is starting to run down. The pacemaker will need to be replaced when the battery starts to run down.   Do not use amateur radio equipment or electric welding torches. Other electrical devices are safe to use, including power tools, lawn mowers, and speakers. If you are unsure of whether something is safe to   use, ask your health care provider.   When using your cell phone, hold it to the ear opposite the pacemaker. Do not leave your cell phone in a pocket over the pacemaker.   Avoid places or objects that have a strong electric or magnetic field, including:  ? Airport security gates. When at the airport, let officials know that you have a pacemaker.  ? Power plants.  ? Large electrical generators.  ? Radiofrequency transmission towers, such as cell phone and radio towers.  General instructions   Weigh yourself every day. If you suddenly gain weight, fluid may be building up in your body.   Keep all follow-up visits as told by your health care provider. This is important.  Contact a health care provider if:   You gain  weight suddenly.   Your legs or feet swell.   It feels like your heart is fluttering or skipping beats (heart palpitations).   You have chills or a fever.   You have more redness, swelling, or pain around your incisions.   You have more fluid or blood coming from your incisions.   Your incisions feel warm to the touch.   You have pus or a bad smell coming from your incisions.  Get help right away if:   You have chest pain.   You have trouble breathing or are short of breath.   You become extremely tired.   You are light-headed or you faint.  This information is not intended to replace advice given to you by your health care provider. Make sure you discuss any questions you have with your health care provider.  Document Released: 07/27/2004 Document Revised: 10/20/2015 Document Reviewed: 10/20/2015  Elsevier Interactive Patient Education  2019 Elsevier Inc.

## 2018-05-01 NOTE — Discharge Summary (Signed)
[facility] Cardiology Discharge Summary  Patient ID: Timothy Mcmahon MRN: [id number] DOB/AGE: [birth year] 76 y.o.  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  Patient underwent dual-chamber pacemaker placement without complication.  He did have some minimal oozing and hematoma after in the left upper chest with some discomfort but this resolved with Tylenol.  He now has felt well with ambulation in the morning and there is been no other significant oozing or concerns.  Chest x-ray was performed showing no evidence of changes concerning or no pneumothorax.  Patient was replaced on appropriate medication management for other cardiovascular risk factors and ambulating well. Discharge Exam: Blood pressure 137/74, pulse 94, temperature 98.2 F (36.8 C), temperature source Oral, resp. rate 18, height 5\' 11"  (1.803 m), weight 68.9 kg, SpO2 97 %.  Constitutional: A[place] oriented to person, place, and time. No distress.  HENT: No nasal discharge.  Head: Normocephalic and atraumatic.  Eyes: Pupils are equal and round. No discharge.  Neck: Normal range of motion. Neck supple. No JVD present. No thyromegaly present.  Cardiovascular: Normal rate, regular rhythm, normal S1 S2, no gallop, no friction rub. No murmur Pulmonary/Chest: Effort normal, No stridor. No respiratory distress. no wheezes.  no rales.    Abdominal: Soft. Bowel sounds are normal.  no distension.  no tenderness. There is no rebound and no guarding.  Musculoskeletal: No edema, no cyanosis, normal pulses, no bleeding, Normal range  of motion. no tenderness.  Neurological:  alert and oriented to person, place, and time. Coordination normal.  Skin: Skin is warm and dry. No rash noted. No erythema. No pallor.  Psychiatric:  normal mood and affect. behavior is normal.    Labs:   Lab Results  Component Value Date   WBC 6.1 04/15/2018   HGB 11.4 (L) 04/15/2018   HCT 34.2 (L) 04/15/2018   MCV 95.8 04/15/2018   PLT 135 (L) 04/15/2018   No results for input(s): NA, K, CL, CO2, BUN, CREATININE, CALCIUM, PROT, BILITOT, ALKPHOS, ALT, AST, GLUCOSE in the last 168 hours.  Invalid input(s): LABALBU  EKG: NSR without evidence of new changes  FOLLOW UP IN ONE TO TWO WEEKS  Allergies as of 05/01/2018      Reactions   Aspirin Nausea And Vomiting   With high dose ASA only      Medication List    TAKE these medications   albuterol 108 (90 Base) MCG/ACT inhaler Commonly known as:  PROVENTIL HFA;VENTOLIN HFA Inhale 1-2 puffs into the lungs every 6 (six) hours as needed for wheezing.   albuterol (2.5 MG/3ML) 0.083% nebulizer solution Commonly known as:  PROVENTIL Take 3 mLs by nebulization 2 (two) times daily.   atorvastatin 80 MG tablet Commonly known as:  LIPITOR Take 80 mg by mouth daily.   lisinopril 5 MG tablet Commonly known as:  PRINIVIL,ZESTRIL Take 5 mg by mouth daily.   metoprolol tartrate 25 MG tablet Commonly known as:  LOPRESSOR Take 25 mg by mouth 2 (two) times daily.   omeprazole 20 MG capsule Commonly known as:  PRILOSEC Take 20 mg by mouth daily.   Spiriva Respimat 2.5 MCG/ACT Aers Generic drug:  Tiotropium Bromide Monohydrate Inhale 2 puffs  into the lungs daily.   Symbicort 160-4.5 MCG/ACT inhaler Generic drug:  budesonide-formoterol Inhale 2 puffs into the lungs 2 (two) times daily.   tamsulosin 0.4 MG Caps capsule Commonly known as:  FLOMAX Take 0.4 mg by mouth daily.   traZODone 100 MG tablet Commonly known as:  DESYREL Take 100 mg by mouth at bedtime.      Follow-up  Information    Corey Skains, MD Follow up in 4 day(s).   Specialty:  Cardiology Contact information: [address] [facility] [place] [address] [phone]           THE PATIENT  SHALL BRING ALL MEDICATIONS TO FOLLOW UP APPOINTMENT  Signed:  Corey Skains MD, [facility] 05/01/2018, 10:18 AM

## 2018-05-01 NOTE — Progress Notes (Signed)
Discharge instructions given. Pt verbalizes understanding.

## 2018-05-01 NOTE — Progress Notes (Signed)
Pt ambulatory in room. Gait steady. Sling remains intact to left arm. Pt denies pain or discomfort.

## 2018-05-01 NOTE — Progress Notes (Signed)
Pt escorted to vehicle in wheelchair by nursing staff. Pt discharged home with family.

## 2019-04-17 IMAGING — CR CHEST - 2 VIEW
3 series · 3 of 3 positions shown · non-contrast
Comparison: CT chest dated July 04, 2017. Chest x-ray dated May 01, 2016.

CLINICAL DATA: Orthostatic hypotension.

EXAM:
CHEST - 2 VIEW

[chest lat]
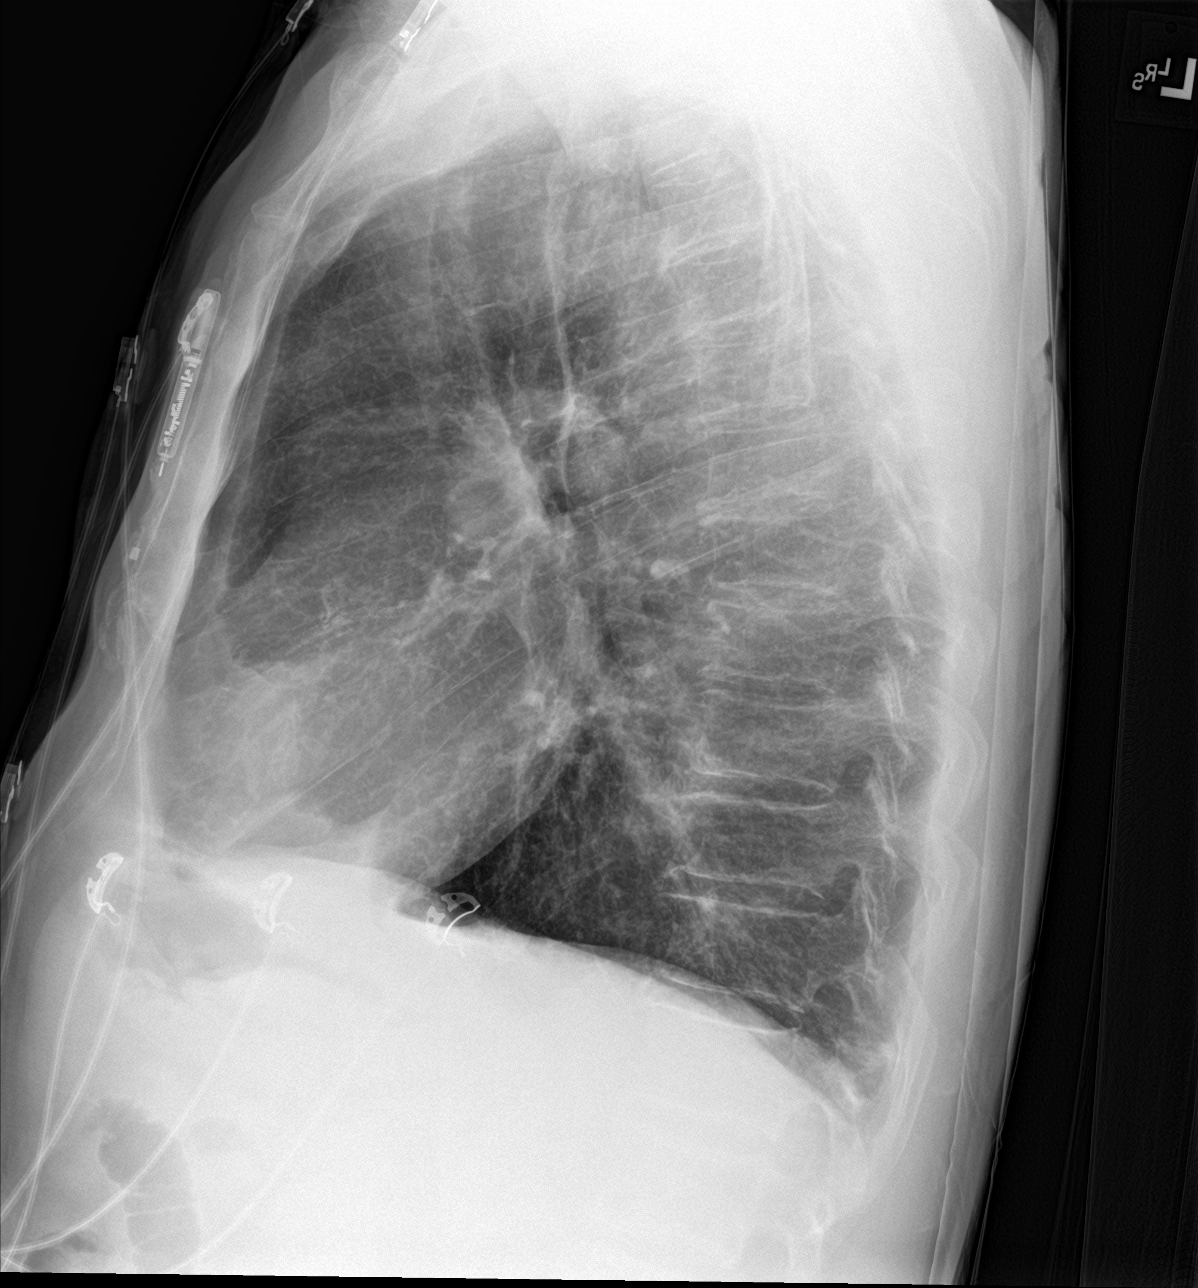

[chest ap (1 of 2)]
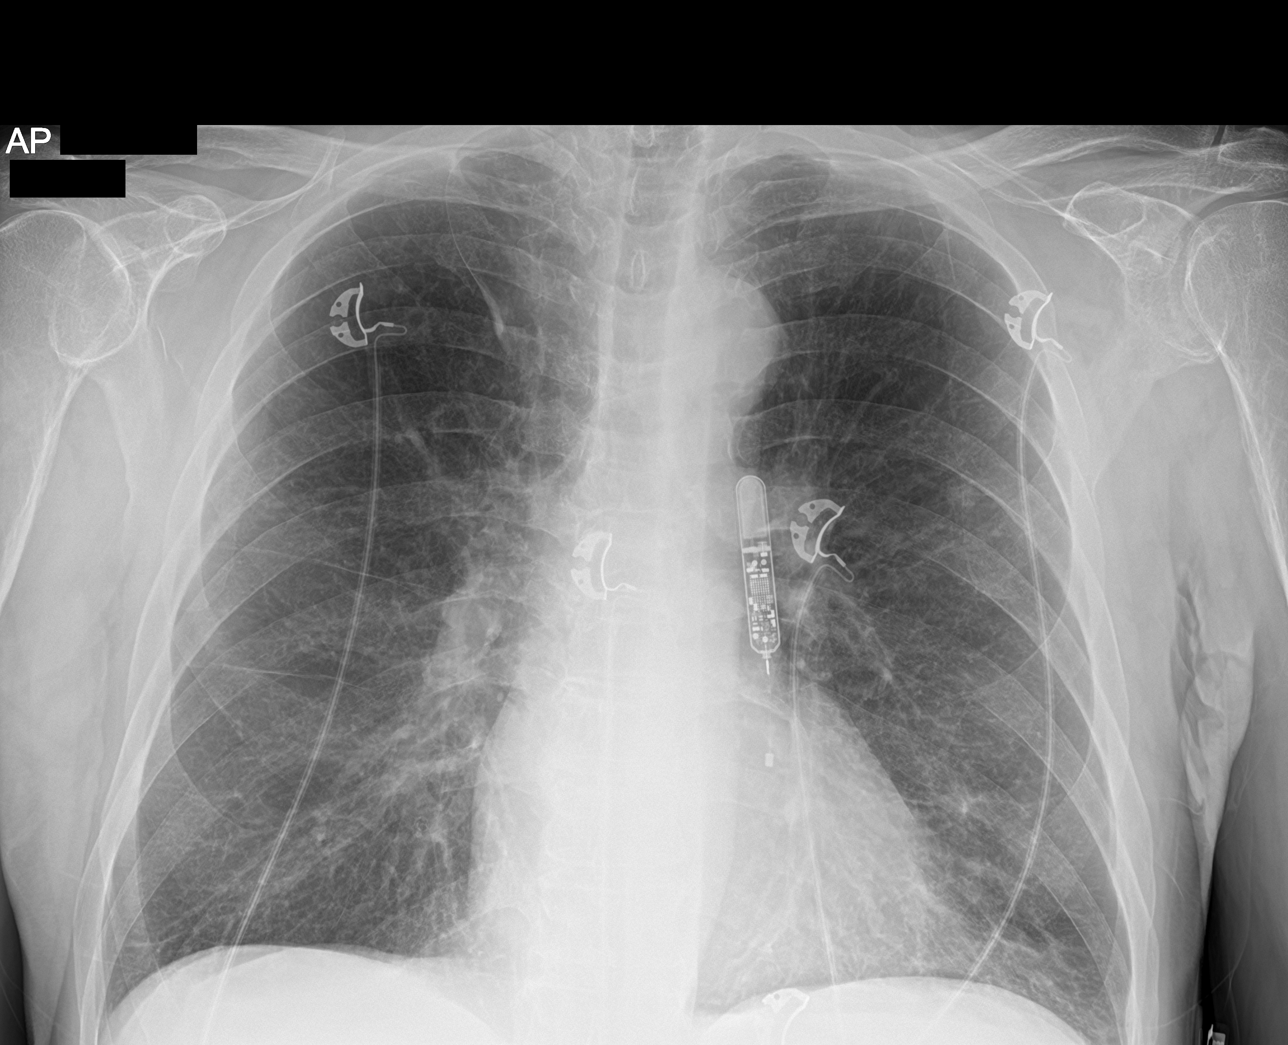

[chest ap (2 of 2)]
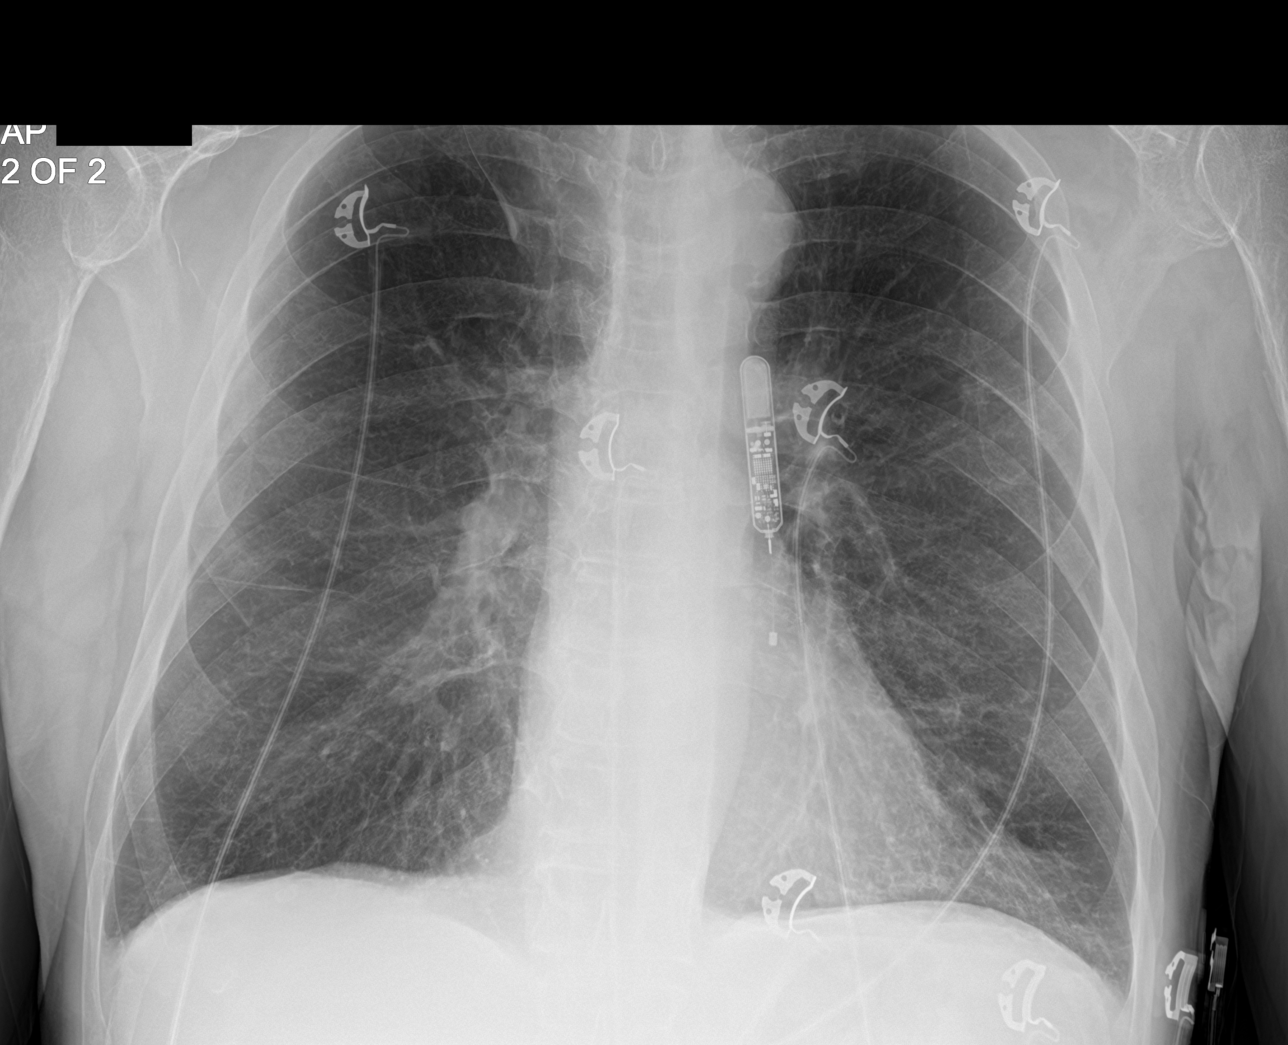

[3 of 3 positions shown; findings below may reference images not displayed]

FINDINGS: New loop recorder. The heart size and mediastinal contours are
within normal limits. Normal pulmonary vascularity. 8 mm nodular
density in the left upper lobe appears slightly increased when
compared to prior chest CT. Minimal left basilar atelectasis. No
focal consolidation, pleural effusion, or pneumothorax. No acute
osseous abnormality.
IMPRESSION: 1. 8 mm nodular density in the left upper lobe appears slightly
increased when compared to prior chest CT. Further evaluation with
non-emergent chest CT is recommended.
2.  No active cardiopulmonary disease.

## 2019-09-16 DIAGNOSIS — C3490 Malignant neoplasm of unspecified part of unspecified bronchus or lung: Secondary | ICD-10-CM

## 2019-09-23 DIAGNOSIS — C3412 Malignant neoplasm of upper lobe, left bronchus or lung: Secondary | ICD-10-CM

## 2019-09-23 DIAGNOSIS — D649 Anemia, unspecified: Secondary | ICD-10-CM

## 2019-10-27 ENCOUNTER — Other Ambulatory Visit: Payer: Self-pay | Admitting: Hematology and Oncology

## 2019-10-27 DIAGNOSIS — C3412 Malignant neoplasm of upper lobe, left bronchus or lung: Secondary | ICD-10-CM | POA: Diagnosis not present

## 2019-10-27 LAB — CBC AND DIFFERENTIAL
HCT: 38 — AB (ref 41–53)
Hemoglobin: 12.9 — AB (ref 13.5–17.5)
Neutrophils Absolute: 7
Platelets: 131 — AB (ref 150–399)
WBC: 8.7

## 2019-10-27 LAB — BASIC METABOLIC PANEL
BUN: 29 — AB (ref 4–21)
CO2: 25 — AB (ref 13–22)
Chloride: 104 (ref 99–108)
Creatinine: 1.2 (ref 0.6–1.3)
Glucose: 105
Potassium: 3.5 (ref 3.4–5.3)
Sodium: 138 (ref 137–147)

## 2019-10-27 LAB — HEPATIC FUNCTION PANEL
ALT: 43 — AB (ref 10–40)
AST: 40 (ref 14–40)
Alkaline Phosphatase: 174 — AB (ref 25–125)
Bilirubin, Total: 0.3

## 2019-10-27 LAB — COMPREHENSIVE METABOLIC PANEL
Albumin: 3 — AB (ref 3.5–5.0)
Calcium: 9.1 (ref 8.7–10.7)

## 2019-10-27 LAB — CBC: RBC: 4.03 (ref 3.87–5.11)

## 2019-11-05 ENCOUNTER — Other Ambulatory Visit (HOSPITAL_COMMUNITY): Payer: Self-pay | Admitting: Hematology and Oncology

## 2019-11-05 ENCOUNTER — Telehealth: Payer: Self-pay | Admitting: Internal Medicine

## 2019-11-05 ENCOUNTER — Other Ambulatory Visit: Payer: Self-pay | Admitting: Hematology and Oncology

## 2019-11-05 DIAGNOSIS — C3412 Malignant neoplasm of upper lobe, left bronchus or lung: Secondary | ICD-10-CM

## 2019-11-05 NOTE — Telephone Encounter (Signed)
Called patient using all three numbers provided in chart. No answer on any of the numbers. Left message on patient's home number asking patient to call back to schedule appointment per [facility] message from nurse Beth, 10/15.

## 2019-11-09 ENCOUNTER — Other Ambulatory Visit: Payer: Self-pay | Admitting: Radiation Therapy

## 2019-11-10 ENCOUNTER — Telehealth: Payer: Self-pay | Admitting: Internal Medicine

## 2019-11-10 NOTE — Telephone Encounter (Signed)
Ms. Loiseau cld and has been scheduled to see Dr. Mickeal Skinner on 10/28 at 1130am. Pt aware to arrive 30 minutes early.

## 2019-11-12 ENCOUNTER — Other Ambulatory Visit: Payer: Self-pay | Admitting: Hematology and Oncology

## 2019-11-12 LAB — COMPREHENSIVE METABOLIC PANEL
Albumin: 3 — AB (ref 3.5–5.0)
Calcium: 8.8 (ref 8.7–10.7)

## 2019-11-12 LAB — BASIC METABOLIC PANEL
BUN: 24 — AB (ref 4–21)
CO2: 23 — AB (ref 13–22)
Chloride: 102 (ref 99–108)
Creatinine: 1.1 (ref 0.6–1.3)
Glucose: 162
Potassium: 3.8 (ref 3.4–5.3)
Sodium: 132 — AB (ref 137–147)

## 2019-11-12 LAB — CBC AND DIFFERENTIAL
HCT: 45 (ref 41–53)
Hemoglobin: 15 (ref 13.5–17.5)
Neutrophils Absolute: 8.01
Platelets: 67 — AB (ref 150–399)
WBC: 8.8

## 2019-11-12 LAB — HEPATIC FUNCTION PANEL
ALT: 50 — AB (ref 10–40)
AST: 29 (ref 14–40)
Alkaline Phosphatase: 110 (ref 25–125)
Bilirubin, Total: 0.5

## 2019-11-12 LAB — CBC: RBC: 4.69 (ref 3.87–5.11)

## 2019-11-17 ENCOUNTER — Other Ambulatory Visit: Payer: Self-pay | Admitting: Hematology and Oncology

## 2019-11-17 LAB — HEPATIC FUNCTION PANEL
ALT: 53 — AB (ref 10–40)
AST: 36 (ref 14–40)
Alkaline Phosphatase: 94 (ref 25–125)
Bilirubin, Total: 0.5

## 2019-11-17 LAB — BASIC METABOLIC PANEL
BUN: 31 — AB (ref 4–21)
CO2: 24 — AB (ref 13–22)
Chloride: 106 (ref 99–108)
Creatinine: 1.1 (ref 0.6–1.3)
Glucose: 123
Potassium: 4.6 (ref 3.4–5.3)
Sodium: 134 — AB (ref 137–147)

## 2019-11-17 LAB — CBC AND DIFFERENTIAL
HCT: 45 (ref 41–53)
Hemoglobin: 14.6 (ref 13.5–17.5)
Neutrophils Absolute: 4.98
Platelets: 41 — AB (ref 150–399)
WBC: 6

## 2019-11-17 LAB — COMPREHENSIVE METABOLIC PANEL
Albumin: 2.8 — AB (ref 3.5–5.0)
Calcium: 8.9 (ref 8.7–10.7)

## 2019-11-17 LAB — CBC: RBC: 4.58 (ref 3.87–5.11)

## 2019-11-17 NOTE — Progress Notes (Signed)
Location/Histology of Brain Tumor: Posterior right frontal lobe  Primary Lung with mets to liver, brain, soft tissues of the neck.  Patient presented with symptoms of bilateral upper extremity weakness with the left side being worse.  Mainly with the grip.  MRI Brain/C Spine 11/29/2019:   CT Head/C Spine 10/22/2019: Multiple enhancing soft tissue lesion within the bilateral cervical paraspinal musculature and left trapezius muscle.  There is a 1.6 cm posterior right frontal lobe lesion, left splenial lesion, left corona radiata/basal ganglia lesion that are enhancing concerning for metastatic lesions.  Past or anticipated interventions, if any, per neurosurgery:  Dr. Reatha Armour 11/08/2019 -At this time I recommend her undergo radiosurgery for his metastatic lesions to his brain and cervical spine.   -I am referring him to Dr. Mickeal Skinner as well as ordering an MRI of the brain and cervical spine with an ICD/Pacemaker protocol for metastatic disease.  Past or anticipated interventions, if any, per medical oncology: Dr. Mickeal Skinner 11/18/2019    Dose of Decadron, if applicable: 4 mg BID  Recent neurologic symptoms, if any:   Seizures: No  Headaches: No  Nausea: No  Dizziness/ataxia: Has unstable gait, family working on him to use cane/walker.  Difficulty with hand coordination:   Focal numbness/weakness: Left arm numb, unable to use left hand.  Right side has slight numbness.  Visual deficits/changes: No vision changes, hard of hearing at baseline.  Confusion/Memory deficits: Has some confusion   SAFETY ISSUES:  Prior radiation? No  Pacemaker/ICD? Yes,  Possible current pregnancy? n/a  Is the patient on methotrexate? No  Additional Complaints / other details:

## 2019-11-18 ENCOUNTER — Other Ambulatory Visit: Payer: Self-pay

## 2019-11-18 ENCOUNTER — Ambulatory Visit
Admission: RE | Admit: 2019-11-18 | Discharge: 2019-11-18 | Disposition: A | Discharge status: Home / Self Care | Payer: Medicare Other | Source: Ambulatory Visit | Attending: Radiation Oncology | Admitting: Radiation Oncology

## 2019-11-18 ENCOUNTER — Inpatient Hospital Stay: Payer: Medicare Other | Attending: Internal Medicine | Admitting: Internal Medicine

## 2019-11-18 ENCOUNTER — Encounter: Payer: Self-pay | Admitting: Internal Medicine

## 2019-11-18 DIAGNOSIS — Z85118 Personal history of other malignant neoplasm of bronchus and lung: Secondary | ICD-10-CM | POA: Insufficient documentation

## 2019-11-18 DIAGNOSIS — C7931 Secondary malignant neoplasm of brain: Secondary | ICD-10-CM

## 2019-11-18 DIAGNOSIS — Z79899 Other long term (current) drug therapy: Secondary | ICD-10-CM | POA: Diagnosis not present

## 2019-11-18 DIAGNOSIS — C3402 Malignant neoplasm of left main bronchus: Secondary | ICD-10-CM | POA: Insufficient documentation

## 2019-11-18 DIAGNOSIS — F17210 Nicotine dependence, cigarettes, uncomplicated: Secondary | ICD-10-CM | POA: Diagnosis not present

## 2019-11-18 NOTE — Progress Notes (Signed)
Radiation Oncology         [phone] ________________________________  Initial Outpatient Consultation - Conducted via telephone due to current COVID-19 concerns for limiting patient exposure  I spoke with the patient to conduct this consult visit via telephone to spare the patient unnecessary potential exposure in the healthcare setting during the current COVID-19 pandemic. The patient was notified in advance and was offered a [place] meeting to allow for face to face communication but unfortunately reported that they did not have the appropriate resources/technology to support such a visit and instead preferred to proceed with a telephone consult.     Name: Timothy Mcmahon        MRN: [id number]  Date of Service: 11/18/2019 DOB: 05-01-1940  WF:UXNAT, Hunt Oris, MD  Mickeal Skinner Acey Lav, MD     REFERRING PHYSICIAN: Ventura Sellers, MD   DIAGNOSIS: The primary encounter diagnosis was Brain metastases [facility]). A diagnosis of Malignant neoplasm of hilus of left lung (HCC) was also pertinent to this visit.   HISTORY OF PRESENT ILLNESS: Timothy Mcmahon is a 79 y.o. male seen at the request of Dr. Dr. 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  The patient apparently was diagnosed after undergoing a liver biopsy in July 2021 and has been under the care of Dr. Hinton Rao on Suzette Battiest.  Apparently he also has bony disease that is known.  He started developing some tingling in his left upper extremity in September 2021 and has recently started to notice tingling in his right upper extremity.  He was evaluated by his primary care provider and sent for CTs of the neck and head, there was concern of possible disease in 3 sites of the brain and the patient also has a history of a left cerebral hemisphere stroke sometime that occurred in the summer 2021.  When he proceeded with a CT scan of the cervical  spine and head with contrast media on 10/22/2019 it appeared that there are multiple enhancing soft tissue lesions in the bilateral cervical paraspinous musculature and left trapezius muscle the largest at the level of C3-5 measuring 3.5 cm.  His CT head showed concerns for chronic quarterly based infarct in the left anterior lateral left frontal lobe measuring 3.2 x 2 cm, and a 1.6 cm peripherally enhancing lesion in the posterior right frontal lobe with surrounding edema, a 1.6 cm enhancing lesion in the callosal splenule on of the left with mild surrounding edema and a 5 Mcmahon enhancing lesion in the left corona radiata/basal ganglia with mild surrounding edema was also identified.  He was referred to neurosurgery, and Dr. Reatha Armour did not feel that his neuropathic symptoms were the result of spinal stenosis or arthritic changes, he recommended proceeding with an MRI of the cervical spine and brain with 3T protocol and to be evaluated in the cancer center for consideration of stereotactic treatment.  He is contacted today by phone and his daughters and wife join [place].     PREVIOUS RADIATION THERAPY: No   PAST MEDICAL HISTORY:  Past Medical History:  Diagnosis Date  . CAD (coronary artery disease)   . COPD (chronic obstructive pulmonary disease) ([place])   . Hyperlipidemia   . Peripheral vascular disease ([place])        PAST SURGICAL HISTORY: Past Surgical History:  Procedure Laterality Date  . CARDIAC CATHETERIZATION  2017   two stents in my heart   .  heart monitor internal Left   . left leg stents Left   . PACEMAKER INSERTION N/A 04/30/2018   Procedure: INSERTION PACEMAKER DUAL CHAMBER;  Surgeon: Isaias Cowman, MD;  Location: [facility] ORS;  Service: Cardiovascular;  Laterality: N/A;     FAMILY HISTORY:  Family History  Problem Relation Age of Onset  . Heart attack Mother   . Cancer Brother      SOCIAL HISTORY:  reports that he has been smoking cigarettes. He has a 52.00 pack-year smoking  history. He has never used smokeless tobacco. He reports previous alcohol use. He reports previous drug use.  The patient is married and resides in [place].   ALLERGIES: Aspirin   MEDICATIONS:  Current Outpatient Medications  Medication Sig Dispense Refill  . clopidogrel (PLAVIX) 75 MG tablet Take by mouth.    . diclofenac Sodium (VOLTAREN) 1 % GEL Apply topically.    Marland Kitchen albuterol (PROVENTIL HFA;VENTOLIN HFA) 108 (90 Base) MCG/ACT inhaler Inhale 1-2 puffs into the lungs every 6 (six) hours as needed for wheezing.    Marland Kitchen albuterol (PROVENTIL) (2.5 MG/3ML) 0.083% nebulizer solution Take 3 mLs by nebulization 2 (two) times daily.    Marland Kitchen atorvastatin (LIPITOR) 80 MG tablet Take 80 mg by mouth daily.    . budesonide-formoterol (SYMBICORT) 160-4.5 MCG/ACT inhaler Inhale 2 puffs into the lungs 2 (two) times daily.    . capmatinib (TABRECTA) 200 MG tablet Take by mouth.    . chlorpheniramine-HYDROcodone (TUSSIONEX) 10-8 MG/5ML SUER SMARTSIG:5 Milliliter(s) By Mouth Every 12 Hours PRN    . dexamethasone (DECADRON) 4 MG tablet Take by mouth.    Marland Kitchen lisinopril (PRINIVIL,ZESTRIL) 5 MG tablet Take 5 mg by mouth daily.    . metoprolol tartrate (LOPRESSOR) 25 MG tablet Take 25 mg by mouth 2 (two) times daily.    Marland Kitchen omeprazole (PRILOSEC) 20 MG capsule Take 20 mg by mouth daily.    . ondansetron (ZOFRAN) 4 MG tablet Take 4 mg by mouth every 4 (four) hours as needed.    . Oxycodone HCl 10 MG TABS Take 10 mg by mouth 4 (four) times daily as needed.    . tamsulosin (FLOMAX) 0.4 MG CAPS capsule Take 0.4 mg by mouth daily.    . traZODone (DESYREL) 100 MG tablet Take 100 mg by mouth at bedtime.    Marland Kitchen XTAMPZA ER 18 MG C12A Take 1 capsule by mouth 2 (two) times daily.     No current facility-administered medications for this encounter.     REVIEW OF SYSTEMS: On review of systems, the patient reports that he is doing okay, he is not experiencing any neck pain, swelling or limited range of motion.  He denies any  shoulder pain, he does continue to have tingling and weakness of his left upper extremity and this was documented from Dr. Rubbie Battiest notes as well that there was grip and extensor weakness on the left side, the patient also reports that he is starting to have tingling down his right extremity, but this is not interfering with ADLs.  He denies any pain in other parts of the body difficulty with breathing, or chest pain.  No other complaints are verbalized.     PHYSICAL EXAM:  Unable to assess given encounter type   ECOG = 1  0 - Asymptomatic (Fully active, able to carry on all predisease activities without restriction)  1 - Symptomatic but completely ambulatory (Restricted in physically strenuous activity but ambulatory and able to carry out work of a light or sedentary  nature. For example, light housework, office work)  2 - Symptomatic, <50% in bed during the day (Ambulatory and capable of all self care but unable to carry out any work activities. Up and about more than 50% of waking hours)  3 - Symptomatic, >50% in bed, but not bedbound (Capable of only limited self-care, confined to bed or chair 50% or more of waking hours)  4 - Bedbound (Completely disabled. Cannot carry on any self-care. Totally confined to bed or chair)  5 - Death   Timothy Mcmahon, Timothy Mcmahon, Timothy Mcmahon, et al. [phone]). "Toxicity and response criteria of the [facility]". [place] Oncol. 5 (6): 649-55    LABORATORY DATA:  Lab Results  Component Value Date   WBC 6.0 11/17/2019   HGB 14.6 11/17/2019   HCT 45 11/17/2019   MCV 95.8 04/15/2018   PLT 41 (A) 11/17/2019   Lab Results  Component Value Date   NA 134 (A) 11/17/2019   K 4.6 11/17/2019   CL 106 11/17/2019   CO2 24 (A) 11/17/2019   Lab Results  Component Value Date   ALT 53 (A) 11/17/2019   AST 36 11/17/2019   ALKPHOS 94 11/17/2019   BILITOT 0.6 04/13/2018      RADIOGRAPHY: No results found.     IMPRESSION/PLAN: 1. 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  Dr. Lisbeth Renshaw discusses the findings from his recent imaging at [place] and we also tried to review the most up-to-date scans that had also been performed within the [facility] system.  We will reach out to Dr. Hinton Rao to clarify his systemic therapy as his daughter say that there is plan to hold off on his therapy for now due to blood counts.  Regarding his disease in the brain, Dr. Lisbeth Renshaw agrees with 3T MRI scan as he would likely be a candidate for stereotactic radiosurgery.  We discussed the rationale as well as the involvement of multiple providers in this type of therapy and would anticipate a single fraction treatment.  We will also reach out to his cardiologist given his indwelling pacemaker.  We reviewed the risks, benefits, short and long-term effects of radiotherapy and the patient is interested in proceeding.  We also will discuss his case in upcoming brain and spine oncology conference as his weakness does not appear directly the result of the findings by imaging. 2. Upper extremity weakness.  Again Dr. Lisbeth Renshaw discusses the findings from the patient's recent CT imaging but prefers making further decisions about treatment to the spine or neck region until we have more clear information from his upcoming MRI scan.  We will follow-up with Dr. Renda Rolls evaluation of him as well.  The patient is aware that there may be a role for additional treatment if he became more symptomatic or if these areas were truly felt to be causing  Neuropathy. 3. In situ ICD.  We will reach out to the patient's cardiologist for permission to proceed with treatment given his implanted device.  Given current concerns for patient exposure during the COVID-19 pandemic, this encounter was conducted via telephone.  The patient has provided two factor identification and has given verbal consent for this type of  encounter and has been advised to only accept a meeting of this type in a secure network environment. The time spent during this encounter was 60 minutes including preparation, discussion, and coordination of the patient's care.  The attendants for this meeting include Blenda Nicely, RN, Dr. Lisbeth Renshaw, Hayden Pedro  and [name].  During the encounter,  Blenda Nicely, RN, Dr. Lisbeth Renshaw, and Hayden Pedro were located at [facility] Radiation Oncology Department.  Timothy Mcmahon was located at home.   The above documentation reflects my direct findings during this shared patient visit. Please see the separate note by Dr. Lisbeth Renshaw on this date for the remainder of the patient's plan of care.    Carola Rhine, PAC

## 2019-11-19 ENCOUNTER — Other Ambulatory Visit: Payer: Self-pay | Admitting: Oncology

## 2019-11-19 NOTE — Progress Notes (Signed)
[facility] [address] [place], [place] [address] [phone]   New Patient Evaluation  Date of Service: 11/19/19 Patient Name: Timothy Mcmahon Patient MRN: [id number] Patient DOB: 11/06/1940 Provider: Ventura Sellers, MD  Identifying Statement:  Timothy Mcmahon is a 79 y.o. 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    Referring Provider: Coy Saunas, MD No address on file  Primary Cancer: [facility] Lung adenocarcinoma, Stage IV, +MET amiplification  Oncologic History: Oncology History   No history exists.    History of Present Illness: The patient's records from the referring physician were obtained and reviewed and the patient interviewed to confirm this HPI.  Timothy Mcmahon presents today to discuss new diagnosis of brain metastases.  He was recently diagnosed with metastatic [place] lung cancer following liver biopsy in august.  This past month he developed some left sided numbness accompanied by some weakness affecting the left arm.  CT head demonstrated three metastatic lesions that were not previously visualized on MRI study from July 2021.  Dexamethasone was started 4 weeks ago, 17m three times per day, but he "ran out" 3-4 days ago and has since stopped the drug.  He does not feel adverse affects at this time.  Denies seizures, no problems with headaches.  Functional status is intact including walking, dressing, cooking.  Currently on oral Tabrecta rather than cytotoxic chemotherapy given MET alteration status.    Medications: Current Outpatient Medications on File Prior to Visit  Medication Sig Dispense Refill  . albuterol (PROVENTIL HFA;VENTOLIN HFA) 108 (90 Base) MCG/ACT inhaler Inhale 1-2 puffs into the lungs every 6 (six) hours as needed for wheezing.    .Marland Kitchenatorvastatin (LIPITOR) 80 MG tablet Take 80 mg by mouth daily.    .  budesonide-formoterol (SYMBICORT) 160-4.5 MCG/ACT inhaler Inhale 2 puffs into the lungs 2 (two) times daily.    . capmatinib (TABRECTA) 200 MG tablet Take by mouth.    . dexamethasone (DECADRON) 4 MG tablet Take by mouth.    .Marland Kitchenlisinopril (PRINIVIL,ZESTRIL) 5 MG tablet Take 5 mg by mouth daily.    . metoprolol tartrate (LOPRESSOR) 25 MG tablet Take 25 mg by mouth 2 (two) times daily.    .Marland Kitchenomeprazole (PRILOSEC) 20 MG capsule Take 20 mg by mouth daily.    . ondansetron (ZOFRAN) 4 MG tablet Take 4 mg by mouth every 4 (four) hours as needed.    . Oxycodone HCl 10 MG TABS Take 10 mg by mouth 4 (four) times daily as needed.    . tamsulosin (FLOMAX) 0.4 MG CAPS capsule Take 0.4 mg by mouth daily.    . traZODone (DESYREL) 100 MG tablet Take 100 mg by mouth at bedtime.    .Marland KitchenXTAMPZA ER 18 MG C12A Take 1 capsule by mouth 2 (two) times daily.    .Marland Kitchenalbuterol (PROVENTIL) (2.5 MG/3ML) 0.083% nebulizer solution Take 3 mLs by nebulization 2 (two) times daily.    . chlorpheniramine-HYDROcodone (TUSSIONEX) 10-8 MG/5ML SUER SMARTSIG:5 Milliliter(s) By Mouth Every 12 Hours PRN    . clopidogrel (PLAVIX) 75 MG tablet Take by mouth.    . diclofenac Sodium (VOLTAREN) 1 % GEL Apply topically.     No current facility-administered medications on file prior to visit.    Allergies:  Allergies  Allergen Reactions  . Aspirin Nausea And Vomiting    With high dose ASA only   Past Medical History:  Past Medical History:  Diagnosis Date  . CAD (coronary artery disease)   . COPD (chronic obstructive pulmonary disease) ([place])   . Hyperlipidemia   . Peripheral vascular disease [facility])    Past Surgical History:  Past Surgical History:  Procedure Laterality Date  . CARDIAC CATHETERIZATION  2017   two stents in my heart   . heart monitor internal Left   . left leg stents Left   . PACEMAKER INSERTION N/A 04/30/2018   Procedure: INSERTION PACEMAKER DUAL CHAMBER;  Surgeon: Isaias Cowman, MD;  Location: [facility] ORS;   Service: Cardiovascular;  Laterality: N/A;   Social History:  Social History   Socioeconomic History  . Marital status: Married    Spouse name: Not on file  . Number of children: Not on file  . Years of education: Not on file  . Highest education level: Not on file  Occupational History  . Not on file  Tobacco Use  . Smoking status: Current Every Day Smoker    Packs/day: 1.00    Years: 52.00    Pack years: 52.00    Types: Cigarettes  . Smokeless tobacco: Never Used  Substance and Sexual Activity  . Alcohol use: Not Currently  . Drug use: Not Currently  . Sexual activity: Not on file  Other Topics Concern  . Not on file  Social History Narrative  . Not on file   Social Determinants of Health   Financial Resource Strain:   . Difficulty of Paying Living Expenses: Not on file  Food Insecurity:   . Worried About Charity fundraiser in the Last Year: Not on file  . Ran Out of Food in the Last Year: Not on file  Transportation Needs:   . Lack of Transportation (Medical): Not on file  . Lack of Transportation (Non-Medical): Not on file  Physical Activity:   . Days of Exercise per Week: Not on file  . Minutes of Exercise per Session: Not on file  Stress:   . Feeling of Stress : Not on file  Social Connections:   . Frequency of Communication with Friends and Family: Not on file  . Frequency of Social Gatherings with Friends and Family: Not on file  . Attends Religious Services: Not on file  . Active Member of Clubs or Organizations: Not on file  . Attends Archivist Meetings: Not on file  . Marital Status: Not on file  Intimate Partner Violence:   . Fear of Current or Ex-Partner: Not on file  . Emotionally Abused: Not on file  . Physically Abused: Not on file  . Sexually Abused: Not on file   Family History:  Family History  Problem Relation Age of Onset  . Heart attack Mother   . Cancer Brother     Review of Systems: Constitutional: Doesn't report  fevers, chills or abnormal weight loss Eyes: Doesn't report blurriness of vision Ears, nose, mouth, throat, and face: Doesn't report sore throat Respiratory: Doesn't report cough, dyspnea or wheezes Cardiovascular: Doesn't report palpitation, chest discomfort  Gastrointestinal:  Doesn't report nausea, constipation, diarrhea GU: Doesn't report incontinence Skin: Doesn't report skin rashes Neurological: Per HPI Musculoskeletal: Doesn't report joint pain Behavioral/Psych: Doesn't report anxiety  Physical Exam: Vitals:   11/18/19 1125  BP: (!) 105/52  Pulse: (!) 108  Resp: 18  Temp: (!) 97.1 F (36.2 C)  SpO2: (!) 86%   KPS: 90. General: Alert, cooperative, pleasant, in no acute distress Head: Normal EENT: No conjunctival injection or scleral  icterus.  Lungs: Resp effort normal Cardiac: Regular rate Abdomen: Non-distended abdomen Skin: No rashes cyanosis or petechiae. Extremities: No clubbing or edema  Neurologic Exam: Mental Status: Awake, alert, attentive to examiner. Oriented to self and environment. Language is fluent with intact comprehension.  Cranial Nerves: Visual acuity is grossly normal. Visual fields are full. Extra-ocular movements intact. No ptosis. Face is symmetric Motor: Tone and bulk are normal. Power is full in both arms and legs. Reflexes are symmetric, no pathologic reflexes present.  Sensory: Intact to light touch Gait: Normal.   Labs: I have reviewed the data as listed    Component Value Date/Time   NA 134 (A) 11/17/2019 0000   NA 141 11/20/2011 1044   K 4.6 11/17/2019 0000   K 4.1 11/20/2011 1044   CL 106 11/17/2019 0000   CL 109 (H) 11/20/2011 1044   CO2 24 (A) 11/17/2019 0000   CO2 23 11/20/2011 1044   GLUCOSE 106 (H) 04/16/2018 0224   GLUCOSE 92 11/20/2011 1044   BUN 31 (A) 11/17/2019 0000   BUN 17 02/03/2012 1124   CREATININE 1.1 11/17/2019 0000   CREATININE 0.94 04/16/2018 0224   CREATININE 0.89 02/03/2012 1124   CALCIUM 8.9  11/17/2019 0000   CALCIUM 8.9 11/20/2011 1044   PROT 6.6 04/13/2018 1105   ALBUMIN 2.8 (A) 11/17/2019 0000   AST 36 11/17/2019 0000   ALT 53 (A) 11/17/2019 0000   ALKPHOS 94 11/17/2019 0000   BILITOT 0.6 04/13/2018 1105   GFRNONAA >60 04/16/2018 0224   GFRNONAA >60 02/03/2012 1124   GFRAA >60 04/16/2018 0224   GFRAA >60 02/03/2012 1124   Lab Results  Component Value Date   WBC 6.0 11/17/2019   NEUTROABS 4.98 11/17/2019   HGB 14.6 11/17/2019   HCT 45 11/17/2019   MCV 95.8 04/15/2018   PLT 41 (A) 11/17/2019    Imaging:  Pending receipt and review of outside images   Assessment/Plan Brain Metastases  NATURE VOGELSANG presents today with clinical and radiographic syndrome consistent with focal metastases secondary to [facility] lung adenocarcinoma.    His CT images will be uploaded and reviewed in brain/spine tumor board on 11/22/19.  He also has SRS protocol brain and cervical spine MRI studies scheduled for 11/29/19 with cardiology to disable pacer.   Likely treatment plan for CNS disease will include radiosurgery, but will wait on finalized set of imaging.    For now he can remain off dexamethasone if tolerated, given relative lack of symptoms.  If left sided weakness recurs he will call and and we will resume steroids at a lower dose than prior.  We spent twenty additional minutes teaching regarding the natural history, biology, and historical experience in the treatment of neurologic complications of cancer.   We appreciate the opportunity to participate in the care of JUSTUS DROKE.  He may follow up with [place] in 3 months following post-SRS MRI brain or sooner if needed for neurologic complaints.  All questions were answered. The patient knows to call the clinic with any problems, questions or concerns. No barriers to learning were detected.  The total time spent in the encounter was 45 minutes and more than 50% was on counseling and review of test results   Ventura Sellers, MD Medical Director of Neuro-Oncology [facility] [place] 11/19/19 9:35 AM

## 2019-11-22 ENCOUNTER — Telehealth: Payer: Self-pay

## 2019-11-22 NOTE — Telephone Encounter (Signed)
Pls note he was adm. To [facility] so won't be in for appt.

## 2019-11-22 NOTE — Telephone Encounter (Signed)
Returning call in ref to scheduling.[phone]

## 2019-11-25 ENCOUNTER — Telehealth: Payer: Self-pay

## 2019-11-25 NOTE — Telephone Encounter (Signed)
Pt's daughter called to make [place] aware that her dad is being discharged from [facility] to home with Hospice. They want to thank [place] for all of our care.

## 2019-11-29 ENCOUNTER — Ambulatory Visit (HOSPITAL_COMMUNITY): Payer: Medicare Other

## 2019-11-29 ENCOUNTER — Ambulatory Visit (HOSPITAL_COMMUNITY): Admission: RE | Admit: 2019-11-29 | Payer: Medicare Other | Source: Ambulatory Visit

## 2019-11-29 ENCOUNTER — Encounter (HOSPITAL_COMMUNITY): Payer: Self-pay

## 2019-12-22 DEATH — deceased
